# Patient Record
Sex: Female | Born: 1980 | Race: White | Hispanic: No | Smoking: Former smoker
Health system: Southern US, Community
[De-identification: ages and names within clinical notes are randomized; demographics above are authoritative.]

## PROBLEM LIST (undated history)

## (undated) DIAGNOSIS — T7840XA Allergy, unspecified, initial encounter: Secondary | ICD-10-CM

## (undated) DIAGNOSIS — A6000 Herpesviral infection of urogenital system, unspecified: Secondary | ICD-10-CM

## (undated) DIAGNOSIS — F329 Major depressive disorder, single episode, unspecified: Secondary | ICD-10-CM

## (undated) DIAGNOSIS — K219 Gastro-esophageal reflux disease without esophagitis: Secondary | ICD-10-CM

## (undated) DIAGNOSIS — F32A Depression, unspecified: Secondary | ICD-10-CM

## (undated) DIAGNOSIS — K222 Esophageal obstruction: Secondary | ICD-10-CM

## (undated) DIAGNOSIS — N189 Chronic kidney disease, unspecified: Secondary | ICD-10-CM

## (undated) DIAGNOSIS — F419 Anxiety disorder, unspecified: Secondary | ICD-10-CM

## (undated) HISTORY — PX: OTHER SURGICAL HISTORY: SHX169

## (undated) HISTORY — DX: Gastro-esophageal reflux disease without esophagitis: K21.9

## (undated) HISTORY — DX: Chronic kidney disease, unspecified: N18.9

## (undated) HISTORY — DX: Esophageal obstruction: K22.2

## (undated) HISTORY — DX: Depression, unspecified: F32.A

## (undated) HISTORY — DX: Allergy, unspecified, initial encounter: T78.40XA

## (undated) HISTORY — PX: KIDNEY STONE SURGERY: SHX686

## (undated) HISTORY — DX: Major depressive disorder, single episode, unspecified: F32.9

---

## 2000-12-09 DIAGNOSIS — N189 Chronic kidney disease, unspecified: Secondary | ICD-10-CM

## 2000-12-09 HISTORY — DX: Chronic kidney disease, unspecified: N18.9

## 2008-08-11 ENCOUNTER — Emergency Department (HOSPITAL_COMMUNITY): Admission: EM | Admit: 2008-08-11 | Discharge: 2008-08-11 | Payer: Self-pay | Admitting: Family Medicine

## 2008-08-17 ENCOUNTER — Emergency Department (HOSPITAL_COMMUNITY): Admission: EM | Admit: 2008-08-17 | Discharge: 2008-08-17 | Payer: Self-pay | Admitting: Emergency Medicine

## 2010-02-09 ENCOUNTER — Ambulatory Visit: Payer: Self-pay | Admitting: *Deleted

## 2010-02-14 ENCOUNTER — Ambulatory Visit: Payer: Self-pay | Admitting: *Deleted

## 2010-02-21 ENCOUNTER — Ambulatory Visit: Payer: Self-pay | Admitting: *Deleted

## 2010-04-11 ENCOUNTER — Ambulatory Visit: Payer: Self-pay | Admitting: Family Medicine

## 2010-04-11 ENCOUNTER — Encounter (INDEPENDENT_AMBULATORY_CARE_PROVIDER_SITE_OTHER): Payer: Self-pay | Admitting: *Deleted

## 2010-09-25 ENCOUNTER — Ambulatory Visit (HOSPITAL_COMMUNITY): Payer: Self-pay | Admitting: Psychiatry

## 2010-10-17 ENCOUNTER — Ambulatory Visit (HOSPITAL_COMMUNITY): Payer: Self-pay | Admitting: Psychiatry

## 2010-11-07 ENCOUNTER — Ambulatory Visit (HOSPITAL_COMMUNITY): Payer: Self-pay | Admitting: Psychiatry

## 2010-11-23 ENCOUNTER — Ambulatory Visit (HOSPITAL_COMMUNITY): Payer: Self-pay | Admitting: Psychiatry

## 2010-12-14 ENCOUNTER — Ambulatory Visit (HOSPITAL_COMMUNITY)
Admission: RE | Admit: 2010-12-14 | Discharge: 2010-12-14 | Payer: Self-pay | Source: Home / Self Care | Attending: Psychiatry | Admitting: Psychiatry

## 2010-12-25 ENCOUNTER — Ambulatory Visit (HOSPITAL_COMMUNITY): Admit: 2010-12-25 | Payer: Self-pay | Admitting: Psychiatry

## 2010-12-28 ENCOUNTER — Ambulatory Visit (HOSPITAL_COMMUNITY): Admit: 2010-12-28 | Payer: Self-pay | Admitting: Psychiatry

## 2011-01-08 NOTE — Letter (Signed)
Summary: [place] No Show Letter  [place] [facility] [place]  [address] [place], [place] [address]   Phone: [phone]  Fax: [phone]    04/11/2010 MRN: [id number]  Carolyn Rasmussen [address] [place], [place]  [address]   Dear Ms. Schlottman,   Our records indicate that you missed your scheduled appointment with _______Dr.Tabori__________ on _____5/4/11______.  Please contact this office to reschedule your appointment as soon as possible.  It is important that you keep your scheduled appointments with your physician, so we can provide you the best care possible.  Please be advised that there may be a charge for "no show" appointments.    Sincerely,   [place] [facility]

## 2011-01-22 ENCOUNTER — Encounter (HOSPITAL_COMMUNITY): Payer: Commercial Managed Care - PPO | Admitting: Physician Assistant

## 2011-01-22 DIAGNOSIS — F3130 Bipolar disorder, current episode depressed, mild or moderate severity, unspecified: Secondary | ICD-10-CM

## 2011-02-12 ENCOUNTER — Encounter (HOSPITAL_COMMUNITY): Payer: Commercial Managed Care - PPO | Admitting: Physician Assistant

## 2011-02-12 DIAGNOSIS — F0390 Unspecified dementia without behavioral disturbance: Secondary | ICD-10-CM

## 2011-02-19 ENCOUNTER — Encounter (HOSPITAL_COMMUNITY): Payer: Commercial Managed Care - PPO | Admitting: Psychology

## 2011-03-26 ENCOUNTER — Encounter (HOSPITAL_COMMUNITY): Payer: Commercial Managed Care - PPO | Admitting: Physician Assistant

## 2011-03-26 DIAGNOSIS — F309 Manic episode, unspecified: Secondary | ICD-10-CM

## 2011-04-09 ENCOUNTER — Encounter (HOSPITAL_COMMUNITY): Payer: Commercial Managed Care - PPO | Admitting: Psychology

## 2011-04-09 DIAGNOSIS — F332 Major depressive disorder, recurrent severe without psychotic features: Secondary | ICD-10-CM

## 2011-04-23 ENCOUNTER — Encounter (HOSPITAL_COMMUNITY): Payer: Commercial Managed Care - PPO | Admitting: Psychology

## 2011-04-23 DIAGNOSIS — F331 Major depressive disorder, recurrent, moderate: Secondary | ICD-10-CM

## 2011-04-25 ENCOUNTER — Encounter (HOSPITAL_COMMUNITY): Payer: Commercial Managed Care - PPO | Admitting: Physician Assistant

## 2011-04-25 DIAGNOSIS — F331 Major depressive disorder, recurrent, moderate: Secondary | ICD-10-CM

## 2011-05-03 ENCOUNTER — Encounter (HOSPITAL_COMMUNITY): Payer: Commercial Managed Care - PPO | Admitting: Psychology

## 2011-05-03 DIAGNOSIS — F331 Major depressive disorder, recurrent, moderate: Secondary | ICD-10-CM

## 2011-05-09 ENCOUNTER — Ambulatory Visit (INDEPENDENT_AMBULATORY_CARE_PROVIDER_SITE_OTHER): Payer: Commercial Managed Care - PPO | Admitting: Family Medicine

## 2011-05-09 ENCOUNTER — Encounter: Payer: Self-pay | Admitting: Family Medicine

## 2011-05-09 DIAGNOSIS — J40 Bronchitis, not specified as acute or chronic: Secondary | ICD-10-CM

## 2011-05-09 MED ORDER — GUAIFENESIN-CODEINE 100-10 MG/5ML PO SYRP: 5.0000 mL | ORAL_SOLUTION | Freq: Two times a day (BID) | ORAL | Status: DC | PRN

## 2011-05-09 MED ORDER — BENZONATATE 200 MG PO CAPS: 200.0000 mg | ORAL_CAPSULE | Freq: Three times a day (TID) | ORAL | Status: AC | PRN

## 2011-05-09 MED ORDER — AZITHROMYCIN 250 MG PO TABS: 250.0000 mg | ORAL_TABLET | Freq: Every day | ORAL | Status: AC

## 2011-05-09 NOTE — Progress Notes (Signed)
  Subjective:    Patient ID: Carolyn Rasmussen, female    DOB: 10/25/1981, 30 y.o.   MRN: [id number]  HPI New to establish.  Previous MD- none.  URI- 'really bad sore throat, coughing up stuff'.  Denies facial pain/pressure.  No fevers.  L ear pain.  + sick contacts.  sxs started Monday.  Works in [employer].   Review of Systems For ROS see HPI     Objective:   Physical Exam  Constitutional: She appears well-developed and well-nourished. No distress.  HENT:  Head: Normocephalic and atraumatic.  Nose: Nose normal.  Mouth/Throat: Oropharynx is clear and moist.       TMs normal bilaterally Mild nasal congestion Throat w/out erythema, edema, or exudate  Eyes: Conjunctivae and EOM are normal. Pupils are equal, round, and reactive to light.  Neck: Normal range of motion. Neck supple.  Cardiovascular: Normal rate, regular rhythm, normal heart sounds and intact distal pulses.   No murmur heard. Pulmonary/Chest: Effort normal and breath sounds normal. No respiratory distress. She has no wheezes.       + hacking cough  Lymphadenopathy:    She has no cervical adenopathy.          Assessment & Plan:

## 2011-05-09 NOTE — Patient Instructions (Signed)
This appears to be a bronchitis Take the Azithromycin as directed Use the Tessalon for daytime cough and the codeine syrup for night Add the mucinex to thin your congestion Call with any questions or concerns Hang in there!!!

## 2011-05-12 DIAGNOSIS — J40 Bronchitis, not specified as acute or chronic: Secondary | ICD-10-CM | POA: Insufficient documentation

## 2011-05-12 NOTE — Assessment & Plan Note (Signed)
Pt's sxs and PE consistent w/ bronchitis.  Start zpack given work environment.  Cough meds prn.

## 2011-05-24 ENCOUNTER — Encounter (HOSPITAL_COMMUNITY): Payer: Commercial Managed Care - PPO | Admitting: Psychology

## 2011-06-04 ENCOUNTER — Encounter (HOSPITAL_COMMUNITY): Payer: Commercial Managed Care - PPO | Admitting: Psychology

## 2011-06-06 ENCOUNTER — Encounter (HOSPITAL_COMMUNITY): Payer: Commercial Managed Care - PPO | Admitting: Physician Assistant

## 2011-06-13 ENCOUNTER — Encounter (INDEPENDENT_AMBULATORY_CARE_PROVIDER_SITE_OTHER): Payer: 59 | Admitting: Physician Assistant

## 2011-06-13 DIAGNOSIS — F339 Major depressive disorder, recurrent, unspecified: Secondary | ICD-10-CM

## 2011-09-02 ENCOUNTER — Ambulatory Visit (INDEPENDENT_AMBULATORY_CARE_PROVIDER_SITE_OTHER): Payer: 59 | Admitting: Family Medicine

## 2011-09-02 ENCOUNTER — Encounter: Payer: Self-pay | Admitting: Family Medicine

## 2011-09-02 DIAGNOSIS — Z3009 Encounter for other general counseling and advice on contraception: Secondary | ICD-10-CM

## 2011-09-02 DIAGNOSIS — Z309 Encounter for contraceptive management, unspecified: Secondary | ICD-10-CM

## 2011-09-02 NOTE — Patient Instructions (Signed)
Follow up as needed Discuss the meds w/ your psychiatrist and OB once you have an appt Keep up the good work!  You look great! Call with any questions or concerns GOOD LUCK!!!

## 2011-09-02 NOTE — Progress Notes (Signed)
  Subjective:    Patient ID: Carolyn Rasmussen, female    DOB: March 13, 1981, 30 y.o.   MRN: [id number]  HPI Family planning- is having IUD removed on Thursday.  Wants to get pregnant.  On PNV.  Has appt w/ psych to discuss Abilify and Lexapro.  Thinks she took Lexapro when pregnant w/ the boys.  Wants to know my thoughts on psych meds and pregnancy.  Reports depression worsened during her previous pregnancies.  Is planning on seeing [facility] for pregnancy.   Review of Systems For ROS see HPI     Objective:   Physical Exam  Vitals reviewed. Constitutional: She appears well-developed and well-nourished. No distress.  Psychiatric: She has a normal mood and affect. Her behavior is normal. Judgment and thought content normal.          Assessment & Plan:

## 2011-09-03 DIAGNOSIS — Z3009 Encounter for other general counseling and advice on contraception: Secondary | ICD-10-CM | POA: Insufficient documentation

## 2011-09-03 NOTE — Assessment & Plan Note (Signed)
Strongly encouraged pt to discuss meds w/ both psych and OB.  Both meds are Category C in pregnancy w/ Lexapro contraindicated during 3rd trimester.  That being said, mom's health needs to be maintained during her pregnancy.  Applauded use of PNV.  Will follow along and assist as able.

## 2011-09-05 ENCOUNTER — Encounter (HOSPITAL_COMMUNITY): Payer: 59 | Admitting: Physician Assistant

## 2011-09-25 ENCOUNTER — Ambulatory Visit (INDEPENDENT_AMBULATORY_CARE_PROVIDER_SITE_OTHER): Payer: 59 | Admitting: Family Medicine

## 2011-09-25 ENCOUNTER — Encounter: Payer: Self-pay | Admitting: Family Medicine

## 2011-09-25 ENCOUNTER — Encounter: Payer: Self-pay | Admitting: Gastroenterology

## 2011-09-25 DIAGNOSIS — R1310 Dysphagia, unspecified: Secondary | ICD-10-CM | POA: Insufficient documentation

## 2011-09-25 MED ORDER — ESOMEPRAZOLE MAGNESIUM 40 MG PO CPDR: 40.0000 mg | DELAYED_RELEASE_CAPSULE | Freq: Every day | ORAL | Status: DC

## 2011-09-25 NOTE — Patient Instructions (Signed)
Someone will call you with your GI appt Switch to Nexium.  If the Nexium is expensive- call me! Call with any questions or concerns Hang in there!!!

## 2011-09-25 NOTE — Progress Notes (Signed)
  Subjective:    Patient ID: Carolyn Rasmussen, female    DOB: [birth year], 30 y.o.   MRN: [id number]  HPI Dysphagia- reports difficulty w/ food passing from esophagus to stomach, 'it gets stuck right here' (pointing to just under L ribs).  Has been having sxs x6 months.  + GERD for 'years'.  On prilosec.  Father has had to have multiple esophageal dilation   Review of Systems For ROS see HPI     Objective:   Physical Exam  Vitals reviewed. Constitutional: She appears well-developed and well-nourished. No distress.  Abdominal: Soft. Bowel sounds are normal. She exhibits no distension. There is no tenderness. There is no rebound.          Assessment & Plan:

## 2011-09-29 NOTE — Assessment & Plan Note (Signed)
Switch from OTC omeprazole to Nexium.  Refer to GI for complete eval and tx.

## 2011-10-12 ENCOUNTER — Other Ambulatory Visit (HOSPITAL_COMMUNITY): Payer: Self-pay

## 2011-10-14 ENCOUNTER — Encounter (HOSPITAL_COMMUNITY): Payer: Self-pay | Admitting: Physician Assistant

## 2011-10-14 ENCOUNTER — Ambulatory Visit (HOSPITAL_COMMUNITY): Payer: 59 | Admitting: Physician Assistant

## 2011-10-14 DIAGNOSIS — F339 Major depressive disorder, recurrent, unspecified: Secondary | ICD-10-CM

## 2011-10-14 DIAGNOSIS — F330 Major depressive disorder, recurrent, mild: Secondary | ICD-10-CM | POA: Insufficient documentation

## 2011-10-14 NOTE — Progress Notes (Signed)
  Subjective:   Carolyn Rasmussen is an 30 y.o. female who presents for follow up on treatment of her major depression disorder.  Things have gone well for [name] since her last visit with me.  She has moved into a new house with her sons and her boyfriend. She tells me she is now engaged to be married and is wearing a lovely diamond engagement ring.  Additionally she also tells me she is trying to have a baby.  She is still taking her Abilify and Lexapro. Both of which are category C for pregnancy.  However, she notes being on a psych med while pregnant both times previously.  She also states that she was in patient both times when pregnant.    [name] also notes that she had to drop out of school this semester due to taking on too much at one time.  She was working full time and going to school full time, taking 2 lab sciences.  She plans to return to [employer] in the Spring with a more realistic work load.    She has seen a [facility] at [facility] for individual out patient therapy.           Review of Systems Pertinent items are noted in HPI.   Objective:   Mental Status Examination: Posture and motor behavior: Appropriate Dress, grooming, personal hygiene: Appropriate Facial expression: Appropriate Speech: Appropriate Mood: Appropriate Coherency and relevance of thought: Appropriate Thought content: Appropriate Perceptions: Appropriate Orientation:Appropriate Attention and concentration: Appropriate Memory: : Appropriate Information: Not examined Vocabulary: Appropriate Abstract reasoning: Appropriate Judgment: Appropriate    Assessment:   Experiencing the following symptoms of depression most of the day nearly every day for more than two consecutive weeks: depressed mood  Depressive Disorder:Improving  Suicide Risk Assessment:  Suicidal intent: no Suicidal plan: none Access to means for suicide: no Lethality of means for suicide: no Prior suicide attempts:  no Recent exposure to suicide:no  Multiple life stressors as the patient wants to return to school, have a baby, and get married all within the next year.  Plan:   Pt. Will slowly discontinue the Abilify as educated, and detailed plan is discussed with the patient. She will follow up with her OB-GYN to discuss continuing on the Lexapro if she becomes pregnant. Neftali is advised to rethink her plan and her time line for accomplishing her short and long term goals.  She is encouraged to set realistic goals that will not jeopardize her new relationship which seems to be going extremely well.  She is also encouraged not to set herself up for failure by taking on more than is feasibly realistic.   She will follow up with me in 3-4 weeks. 1. Depression, major, recurrent, mild   Reviewed concept of depression as biochemical imbalance of neurotransmitters and rationale for treatment. Instructed patient to contact office or on-call physician promptly should condition worsen or any new symptoms appear and provided on-call telephone numbers.

## 2011-10-14 NOTE — Patient Instructions (Signed)
  Place depression patient instructions here.

## 2011-10-16 ENCOUNTER — Encounter: Payer: Self-pay | Admitting: Gastroenterology

## 2011-10-16 ENCOUNTER — Ambulatory Visit (INDEPENDENT_AMBULATORY_CARE_PROVIDER_SITE_OTHER): Payer: 59 | Admitting: Gastroenterology

## 2011-10-16 VITALS — BP 106/72 | HR 68 | Ht 65.75 in | Wt 173.0 lb

## 2011-10-16 DIAGNOSIS — R1319 Other dysphagia: Secondary | ICD-10-CM

## 2011-10-16 DIAGNOSIS — K219 Gastro-esophageal reflux disease without esophagitis: Secondary | ICD-10-CM

## 2011-10-16 NOTE — Patient Instructions (Signed)
You have been scheduled for a Upper Endoscopy with [facility]. See separate instructions.  Patient advised to avoid spicy, acidic, citrus, chocolate, mints, fruit and fruit juices.  Limit the intake of caffeine, alcohol and Soda.  Don't exercise too soon after eating.  Don't lie down within 3-4 hours of eating.  Elevate the head of your bed. Continue Nexium daily 30 minutes before breakfast.  cc: Neena Rhymes, MD

## 2011-10-16 NOTE — Progress Notes (Signed)
History of Present Illness: This is a 30 year old female who relates a 5 or 6 year history of frequent reflux symptoms that were previously controlled on Prilosec OTC. Over the past several months her symptoms have worsened and she was recently placed on Nexium with better control of her symptoms. She has had intermittent solid food dysphagia for the past year occasionally required self-induced vomiting. Denies weight loss, abdominal pain, constipation, diarrhea, change in stool caliber, melena, hematochezia, nausea, vomiting, chest pain.  Review of Systems: Pertinent positive and negative review of systems were noted in the above HPI section. All other review of systems were otherwise negative.  Current Medications, Allergies, Past Medical History, Past Surgical History, Family History and Social History were reviewed in [facility] record.  Physical Exam: General: Well developed , well nourished, no acute distress Head: Normocephalic and atraumatic Eyes:  sclerae anicteric, EOMI Ears: Normal auditory acuity Mouth: No deformity or lesions Neck: Supple, no masses or thyromegaly Lungs: Clear throughout to auscultation Heart: Regular rate and rhythm; no murmurs, rubs or bruits Abdomen: Soft, non tender and non distended. No masses, hepatosplenomegaly or hernias noted. Normal Bowel sounds Musculoskeletal: Symmetrical with no gross deformities  Skin: No lesions on visible extremities Pulses:  Normal pulses noted Extremities: No clubbing, cyanosis, edema or deformities noted Neurological: Alert oriented x 4, grossly nonfocal Cervical Nodes:  No significant cervical adenopathy Inguinal Nodes: No significant inguinal adenopathy Psychological:  Alert and cooperative. Normal mood and affect  Assessment and Recommendations:  1. GERD and solid food dysphagia.  I suspect a peptic stricture. Standard antireflux measures and Nexium 40 mg every morning. Schedule endoscopy with  dilation. The risks, benefits, and alternatives to endoscopy with possible biopsy and possible dilation were discussed with the patient and they consent to proceed.

## 2011-10-21 ENCOUNTER — Encounter (HOSPITAL_COMMUNITY): Payer: 59 | Admitting: Physician Assistant

## 2011-10-23 ENCOUNTER — Ambulatory Visit (AMBULATORY_SURGERY_CENTER): Payer: 59 | Admitting: Gastroenterology

## 2011-10-23 ENCOUNTER — Encounter: Payer: Self-pay | Admitting: Gastroenterology

## 2011-10-23 VITALS — BP 109/78 | HR 79 | Temp 96.6°F | Resp 20 | Ht 65.0 in | Wt 173.0 lb

## 2011-10-23 DIAGNOSIS — K209 Esophagitis, unspecified without bleeding: Secondary | ICD-10-CM

## 2011-10-23 DIAGNOSIS — R1319 Other dysphagia: Secondary | ICD-10-CM

## 2011-10-23 DIAGNOSIS — K219 Gastro-esophageal reflux disease without esophagitis: Secondary | ICD-10-CM

## 2011-10-23 DIAGNOSIS — R1310 Dysphagia, unspecified: Secondary | ICD-10-CM

## 2011-10-23 DIAGNOSIS — K222 Esophageal obstruction: Secondary | ICD-10-CM

## 2011-10-23 MED ORDER — SODIUM CHLORIDE 0.9 % IV SOLN: 500.0000 mL | INTRAVENOUS | Status: DC

## 2011-10-23 NOTE — Patient Instructions (Signed)
Please follow the ESOPHAGEAL DILATION DIET as follows: -Nothing by mouth until 12:15pm -Clear liquids for 1 hour 12:15pm-1:15pm -Soft Foods for the rest of today 1:15pm until tomorrow  Esophageal Stricture The esophagus is the long, narrow tube which carries food and liquid from the mouth to the stomach. Sometimes a part of the esophagus becomes narrow and makes it difficult, painful, or even impossible to swallow. This is called an esophageal stricture.  CAUSES  Common causes of blockage or strictures of the esophagus are:  Exposure of the lower esophagus to the acid from the stomach may cause narrowing.   Hiatal hernia in which a small part of the stomach bulges up through the diaphragm can cause a narrowing in the bottom of the esophagus.   Scleroderma is a tissue disorder that affects the esophagus and makes swallowing difficult.   Achalasia is an absence of nerves in the lower esophagus and to the esophageal sphincter. This absence of nerves may be congenital (present since birth). This can cause irregular spasms which do not allow food and fluid through.   Strictures may develop from swallowing materials which damage the esophagus. Examples are acids or alkalis such as lye.   Schatzki's Ring is a narrow ring of non-cancerous tissue which narrows the lower esophagus. The cause of this is unknown.   Growths can block the esophagus.  SYMPTOMS  Some of the problems are difficulty swallowing or pain with swallowing. DIAGNOSIS  Your caregiver often suspects this problem by taking a medical history. They will also do a physical exam. They may then take X-rays and/or perform an endoscopy. Endoscopy is an exam in which a tube like a small flexible telescope is used to look at your esophagus.  TREATMENT  One form of treatment is to dilate the narrow area. This means to stretch it.   When this is not successful, chest surgery may be required. This is a much more extensive form of treatment  with a longer recovery time.  Both of the above treatments make the passage of food and water into the stomach easier. They also make it easier for stomach contents to bubble back into the esophagus. Special medications may be used following the procedure to help prevent further narrowing. Medications may be used to lower the amount of acid in the stomach juice.  SEEK IMMEDIATE MEDICAL CARE IF:   Your swallowing is becoming more painful, difficult, or you are unable to swallow.   You vomit up blood.   You develop black tarry stools.   You develop chills.   You have a fever.   You develop chest or abdominal pain.   You develop shortness of breath, feel lightheaded, or faint.  Follow up with medical care as your caregiver suggests. Document Released: 08/05/2006 Document Revised: 08/07/2011 Document Reviewed: 09/11/2006 [facility] Patient Information 2012 [place], [place].

## 2011-10-24 ENCOUNTER — Telehealth: Payer: Self-pay | Admitting: *Deleted

## 2011-10-24 NOTE — Telephone Encounter (Signed)
Left message for patient to call us if necessary.

## 2011-10-29 ENCOUNTER — Encounter: Payer: Self-pay | Admitting: Gastroenterology

## 2011-11-11 ENCOUNTER — Ambulatory Visit (HOSPITAL_COMMUNITY): Payer: 59 | Admitting: Physician Assistant

## 2011-11-18 ENCOUNTER — Ambulatory Visit (INDEPENDENT_AMBULATORY_CARE_PROVIDER_SITE_OTHER): Payer: 59 | Admitting: Physician Assistant

## 2011-11-18 DIAGNOSIS — F331 Major depressive disorder, recurrent, moderate: Secondary | ICD-10-CM

## 2011-11-18 DIAGNOSIS — F4310 Post-traumatic stress disorder, unspecified: Secondary | ICD-10-CM

## 2011-11-18 NOTE — Progress Notes (Signed)
   [facility]al Health Follow-up Outpatient Visit  [name] November 16, 1981  Date: 11/18/11   Subjective: Carolyn Rasmussen has been followed by Carolyn Rasmussen for the past year. Carolyn Rasmussen has been treating her for symptoms of depression, and she has been stable. At their last appointment, Carolyn Rasmussen stated she was trying to get pregnant, and Carolyn Rasmussen, discontinued her Abilify. That was approximately one month ago, and Carolyn Rasmussen states that, although she is okay, she can tell, that she is not as well managed. Her sleep has been difficult. She has difficulty in initiating sleep, and wakes during the night, occasionally. She denies any suicidal or homicidal ideation. She denies any auditory or visual hallucinations. She reports that she eats for comfort, particularly foods, like macaroni and cheese and ice cream. Carolyn Rasmussen is looking forward to marrying her fianc in November of 2013   There were no vitals filed for this visit.  Mental Status Examination  Appearance:  Well groomed and casually dressed  Alert: Yes Attention: good  Cooperative: Yes Eye Contact: Good Speech:  clear and even  Psychomotor Activity: Normal Memory/Concentration:  intact Oriented: person, place, time/date and situation Mood: Anxious, mildly Affect: Congruent Thought Processes and Associations: Linear Fund of Knowledge: Good Thought Content:  Insight: Good Judgement: Good  Diagnosis:  Maj. depressive disorder, recurrent, moderate, PTSD   Treatment Plan:  WE will continue her Lexapro at 20 mg daily. She has been instructed in relaxation techniques to improve her time to sleep. She also has been advised to increase her exercise level. She has also been advised to eat a healthier diet. Carolyn Rasmussen will return for followup in 2 months.   Suman Trivedi, PA

## 2011-12-25 ENCOUNTER — Ambulatory Visit (INDEPENDENT_AMBULATORY_CARE_PROVIDER_SITE_OTHER): Payer: 59 | Admitting: Family Medicine

## 2011-12-25 ENCOUNTER — Ambulatory Visit: Payer: 59

## 2011-12-25 ENCOUNTER — Encounter: Payer: Self-pay | Admitting: Family Medicine

## 2011-12-25 DIAGNOSIS — N912 Amenorrhea, unspecified: Secondary | ICD-10-CM

## 2011-12-25 DIAGNOSIS — H8110 Benign paroxysmal vertigo, unspecified ear: Secondary | ICD-10-CM

## 2011-12-25 DIAGNOSIS — H9209 Otalgia, unspecified ear: Secondary | ICD-10-CM | POA: Insufficient documentation

## 2011-12-25 MED ORDER — MECLIZINE HCL 25 MG PO TABS: 25.0000 mg | ORAL_TABLET | Freq: Three times a day (TID) | ORAL | Status: AC | PRN

## 2011-12-25 NOTE — Patient Instructions (Signed)
We'll notify you of your lab results Use the Meclizine as needed for vertigo Change positions slowly Drink plenty of fluids Do the exercises to desensitize your ear canals Call with any questions or concerns Hang in there!!!

## 2011-12-25 NOTE — Assessment & Plan Note (Signed)
New.  Pt is hoping to be pregnant.  2 weeks overdue for menses.  Had spotting at time of expected menses.  Pt's home Upreg's have been negative.  Will draw serum test today.

## 2011-12-25 NOTE — Assessment & Plan Note (Signed)
Pt w/ hx of similar.  No red flags on PE.  Start Meclizine prn.  Modified Eppley maneuver handout given along w/ instructions.  Reviewed supportive care and red flags that should prompt return.  Pt expressed understanding and is in agreement w/ plan.

## 2011-12-25 NOTE — Assessment & Plan Note (Signed)
No obvious infection.  This is chronic problem for pt.  No abx at this time.  If continued pain will need ENT referral.

## 2011-12-25 NOTE — Progress Notes (Signed)
  Subjective:    Patient ID: Carolyn Rasmussen, female    DOB: Mar 30, 1981, 31 y.o.   MRN: [id number]  HPI Ear pain- pain has been intermittent, hx of multiple infxns.  Reports she always has intermittent pain but lately this seems worse.  Denies fever or drainage.  Vertigo- sxs started 3 weeks ago.  Worse w/ lying down, sitting up, or turning head rapidly.  Hx of similar.  Drinking a lot of water.  Amenorrhea- LMP 12/3.  Home Upregs have been negative.  Some nausea but also having vertigo.  Had spotting on January 3rd, some mild cramping.   Review of Systems     Objective:   Physical Exam  Vitals reviewed. Constitutional: She is oriented to person, place, and time. She appears well-developed and well-nourished. No distress.  HENT:  Head: Normocephalic and atraumatic.  Nose: Nose normal.  Mouth/Throat: Oropharynx is clear and moist.       No TTP over sinuses TMs normal bilaterally  Eyes: Conjunctivae and EOM are normal. Pupils are equal, round, and reactive to light.  Neck: Normal range of motion. Neck supple. No thyromegaly present.  Lymphadenopathy:    She has no cervical adenopathy.  Neurological: She is alert and oriented to person, place, and time. She has normal reflexes. No cranial nerve deficit. Coordination normal.          Assessment & Plan:

## 2012-01-22 ENCOUNTER — Ambulatory Visit (HOSPITAL_COMMUNITY): Payer: 59 | Admitting: Physician Assistant

## 2012-01-22 ENCOUNTER — Ambulatory Visit (INDEPENDENT_AMBULATORY_CARE_PROVIDER_SITE_OTHER): Payer: 59 | Admitting: Physician Assistant

## 2012-01-22 DIAGNOSIS — F411 Generalized anxiety disorder: Secondary | ICD-10-CM

## 2012-01-22 DIAGNOSIS — F331 Major depressive disorder, recurrent, moderate: Secondary | ICD-10-CM

## 2012-01-22 MED ORDER — TRAZODONE HCL 50 MG PO TABS: 50.0000 mg | ORAL_TABLET | Freq: Every day | ORAL | Status: DC

## 2012-01-22 MED ORDER — DULOXETINE HCL 30 MG PO CPEP: ORAL_CAPSULE | ORAL | Status: DC

## 2012-02-06 ENCOUNTER — Ambulatory Visit (INDEPENDENT_AMBULATORY_CARE_PROVIDER_SITE_OTHER): Payer: 59 | Admitting: Psychology

## 2012-02-06 DIAGNOSIS — F332 Major depressive disorder, recurrent severe without psychotic features: Secondary | ICD-10-CM
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
## 2012-02-19 ENCOUNTER — Ambulatory Visit (INDEPENDENT_AMBULATORY_CARE_PROVIDER_SITE_OTHER): Payer: 59 | Admitting: Physician Assistant

## 2012-02-19 DIAGNOSIS — F411 Generalized anxiety disorder: Secondary | ICD-10-CM

## 2012-02-19 DIAGNOSIS — F331 Major depressive disorder, recurrent, moderate: Secondary | ICD-10-CM

## 2012-02-19 MED ORDER — DULOXETINE HCL 30 MG PO CPEP: 90.0000 mg | ORAL_CAPSULE | Freq: Every day | ORAL | Status: DC

## 2012-02-25 NOTE — Progress Notes (Signed)
   [facility] Behavioral Health Follow-up Outpatient Visit  Carolyn Rasmussen [birth year]  Date: 02/19/12   Subjective: Carolyn Rasmussen presents today to follow up on her medications prescribed for depression.  She reports that her mood has improved on Cymbalta. She also reports that her sleep has improved. She states now that she does not want to sleep all day and all night. She does sleep about 11 hours nightly, as opposed to the 15-16 hours she was sleeping previously. She also reports that she is being more productive in household duties and family activities. She reports that her mood overall is better, but she is still somewhat melancholy. She is also exercising more and eating better. She would like to increase her Cymbalta to 60 mg daily. She denies any suicidal or homicidal ideation. She denies any auditory or visual hallucinations.  There were no vitals filed for this visit.  Mental Status Examination  Appearance: well groomed and casually dressed Alert: Yes Attention: good  Cooperative: Yes Eye Contact: Good Speech: clear and even Psychomotor Activity: Normal Memory/Concentration: intact Oriented: person, place, time/date and situation Mood: Anxious Affect: Appropriate Thought Processes and Associations: Goal Directed and Linear Fund of Knowledge: Good Thought Content:  Insight: Fair Judgement: Good  Diagnosis: Maj. Depressive disorder recurrent, moderate; generalized anxiety disorder.  Treatment Plan: we will increase her Cymbalta to 90 mg daily, and continue her trazodone for sleep. She is encouraged to continue exercising and eating a healthy diet. She will followup in 6 weeks.  Carolyn Daus, PA

## 2012-03-02 NOTE — Progress Notes (Signed)
   [facility] Behavioral Health Follow-up Outpatient Visit  Carolyn Rasmussen Jul 02, 1981  Date: 01/22/12   Subjective: Carolyn Rasmussen presents today to followup on her medications prescribed for depression. She complains that she is sleeping excessively, and her depression is not well managed. She wonders if there is something that she can take other than the Abilify which she found very expensive. She denies any suicidal or homicidal ideation. She denies any auditory or visual hallucinations.  There were no vitals filed for this visit.  Mental Status Examination  Appearance: Well groomed and casually dressed Alert: Yes Attention: good  Cooperative: Yes Eye Contact: Good Speech: Clear and even Psychomotor Activity: Normal Memory/Concentration: Intact Oriented: person, place, time/date and situation Mood: Depressed Affect: Congruent Thought Processes and Associations: Linear Fund of Knowledge: Good Thought Content: Normal Insight: Good Judgement: Good  Diagnosis: Maj. depressive disorder, recurrent, moderate  Treatment Plan: We will discontinue her Lexapro and start her on Cymbalta 30 mg. She will followup in one month.  Carolyn Wence, PA-C

## 2012-03-05 ENCOUNTER — Ambulatory Visit (HOSPITAL_COMMUNITY): Payer: Self-pay | Admitting: Psychology

## 2012-03-23 ENCOUNTER — Telehealth (HOSPITAL_COMMUNITY): Payer: Self-pay | Admitting: *Deleted

## 2012-03-23 NOTE — Telephone Encounter (Signed)
Pt. VM 4/11 c/o increased depression.Informed Dr.Kumar(Alan WattPA out of office).Per Dr.Kumar, left patient phone message 4/11 at 1620 to increase Cymbalta to 120mg  over w/e & call back 4/15.Contacted pt, did not inc. medication due to side effects. Has been sweating excessively and fells her heart racing. Says 90 mg of Cymbalta has not changed how she feels. Stayed in bed all weekend, did not go to work.  Would like to feel better.

## 2012-04-06 ENCOUNTER — Ambulatory Visit (INDEPENDENT_AMBULATORY_CARE_PROVIDER_SITE_OTHER): Payer: 59 | Admitting: Physician Assistant

## 2012-04-06 DIAGNOSIS — F411 Generalized anxiety disorder: Secondary | ICD-10-CM

## 2012-04-06 DIAGNOSIS — F331 Major depressive disorder, recurrent, moderate: Secondary | ICD-10-CM

## 2012-04-06 MED ORDER — TRAZODONE HCL 50 MG PO TABS: 50.0000 mg | ORAL_TABLET | Freq: Every day | ORAL | Status: DC

## 2012-04-06 MED ORDER — LAMOTRIGINE 25 MG PO TABS: ORAL_TABLET | ORAL | Status: DC

## 2012-04-06 NOTE — Progress Notes (Signed)
   [facility] Behavioral Health Follow-up Outpatient Visit  Carolyn Rasmussen 1981-10-27  Date: 04/06/2012   Subjective: Carolyn Rasmussen presents today to followup on her medications prescribed for depression and anxiety. She reports with the increase of Cymbalta to 90 mg her baseline mood has improved, but she feels that her mood swings have gotten more extreme. She reports that couple of weeks ago she was having some suicidal ideation with a plan to inject lidocaine intravenously. She does endorse, though, that she feels more motivated and her energy has improved. She has been taking the trazodone only on nights when she feels that she will not be oh to sleep, and she sleeps well on the nights that she takes the trazodone, but the nights that she does not take the trazodone she has a longer period of time to sleep onset, and she wakes him during the night. She attributes some of her mood swings to her work schedule where she flip-flops between the day and night shift. She hopes that in July her schedule will be more stable. She denies any current suicidal or homicidal ideation. She denies any auditory or visual hallucinations.  There were no vitals filed for this visit.  Mental Status Examination  Appearance: Casual Alert: Yes Attention: good  Cooperative: Yes Eye Contact: Good Speech: Clear and even Psychomotor Activity: Normal Memory/Concentration: Intact Oriented: person, place, time/date and situation Mood: Dysphoric Affect: Blunt Thought Processes and Associations: Logical Fund of Knowledge: Good Thought Content: Normal Insight: Fair Judgement: Good  Diagnosis: Maj. depressive disorder recurrent moderate, generalized anxiety disorder  Treatment Plan: We will continue her trazodone at 50 mg nightly, and encourage her to take at least 25 mg every night. We'll also continue the Cymbalta 90 mg daily. We will add lamotrigine starting at 25 mg daily for 2 weeks, then increase to 50 mg daily  for 2 weeks, then increase to 100 mg daily. She'll return for followup in 5 weeks.  Odessa Morren, PA-C

## 2012-04-14 ENCOUNTER — Ambulatory Visit (INDEPENDENT_AMBULATORY_CARE_PROVIDER_SITE_OTHER): Payer: 59 | Admitting: Psychology

## 2012-04-14 DIAGNOSIS — F332 Major depressive disorder, recurrent severe without psychotic features: Secondary | ICD-10-CM

## 2012-04-14 NOTE — Progress Notes (Signed)
   THERAPIST PROGRESS NOTE  Session Time: 1330 - 1412  Participation Level: Active  Behavioral Response: Casual and NeatAlertDepressed  Type of Therapy: Individual Therapy  Treatment Goals addressed: Coping  Interventions: Supportive and Reframing  Summary: Carolyn Rasmussen is a 31 y.o. female who presents with information that she received my letter telling of my pending retirement and decided she needed to come in to see me before that and request to be assigned to another therapist when I leave.   It has been several months since I have seen her and she catches me up on the recent severe depression after a change of medication.  She also reported that she is living on her own now, having asked Asher Muir to leave.  She has been experiencing problems with parenting her two boys and we talked about that quite a lot.  She notices that they behave much better for her parents, and so has been trying to have some consistency and consequences with them.  Unfortunately, she has very little consistency for herself and finds this very difficult to do with them.  When she felt suicidal, it was with the thought that her children would be better off without her, because then they would be with her parents all the time.  I suggested that she could also give her parents custody of them rather than kill herself to achieve the same end.  Her children have had SI and HI at various times themselves and recently, her 32 year old took one of her antidepressant pills, which scared her.  She has had difficulty making their appointments as well as her own during the last several months, so I reinforced the importance of doing this.  Her affect is blunted, mood depressed and judgment not the best at this time.  Despite this, she also reports that she is feeling better at work and is making a real effort to talk to coworkers and is "even making some friends".  She gives credit to Lamictal for improvement in this social  arena.  Suicidal/Homicidal: Yeswithout intent/plan today, but had felt suicidal and had a plan 3 weeks ago when she considered asking to be admitted to the hospital.  Instead, she slept for most of 3 days and woke feeling somewhat better and then kept her appt. With Jorje Guild, PA and discussed it with him.   Therapist Response: Identified how to seek immediate treatment at any time, day or night, by presenting at [facility] and requesting admission if she soul become suicidal in the future. We talked about future appointments with me for the next several months and then with another counselor.  She does want to do some counseling with her children as well and has an appointment for that. We will be working on her cognitive distortions using CBT   Plan: Return again in 2 weeks.  Diagnosis: Axis I: Major Depression, Recurrent severe    Axis II: Deferred    Ariana Juul, RN 04/14/2012

## 2012-05-08 ENCOUNTER — Ambulatory Visit (HOSPITAL_COMMUNITY): Payer: Self-pay | Admitting: Psychology

## 2012-05-13 ENCOUNTER — Other Ambulatory Visit (HOSPITAL_COMMUNITY): Payer: Self-pay | Admitting: Physician Assistant

## 2012-05-19 ENCOUNTER — Ambulatory Visit (HOSPITAL_COMMUNITY): Payer: 59 | Admitting: Physician Assistant

## 2012-05-19 DIAGNOSIS — F411 Generalized anxiety disorder: Secondary | ICD-10-CM

## 2012-05-19 DIAGNOSIS — F332 Major depressive disorder, recurrent severe without psychotic features: Secondary | ICD-10-CM

## 2012-05-19 MED ORDER — LAMOTRIGINE 100 MG PO TABS: 100.0000 mg | ORAL_TABLET | Freq: Every day | ORAL | Status: DC

## 2012-05-25 ENCOUNTER — Telehealth: Payer: Self-pay | Admitting: Family Medicine

## 2012-05-25 NOTE — Telephone Encounter (Signed)
Refill:Nexium 40mg  capsule. Take 1 capsule by mouth daily. Qty 30. Last fill 04-21-12

## 2012-05-26 MED ORDER — ESOMEPRAZOLE MAGNESIUM 40 MG PO PACK: 40.0000 mg | PACK | Freq: Every day | ORAL | Status: DC

## 2012-05-26 NOTE — Telephone Encounter (Signed)
Called pt to verify she is still taking this medication per noted 3-13 that pt no longer taking due to change in therapy, pt notes she is still taking the medication and does want refill sent to [facility], sent via escribe, pt aware

## 2012-05-26 NOTE — Progress Notes (Signed)
   [facility] Behavioral Health Follow-up Outpatient Visit  Carolyn Rasmussen [birth year]  Date: 05/19/2012   Subjective: [name] presents today to followup on her treatment for her depression and anxiety. At her last appointment she was started on Lamictal using the normal taper of 25 mg daily for 2 weeks and 50 mg daily for 2 weeks then 100 mg daily. She reports she ran out of Lamictal one week ago and stopped taking it. When asked how she was doing she states "not well." She endorses having two horrible weeks where she has wanted to stay in bed, has been crying, her appetite is decreased, she has decreased interest, and has experienced some panic, although her social anxiety has resolved.. She reports that she missed 6 days of work over the past 2 weeks, and was having some suicidal ideation to overdose by injecting herself with lidocaine. She denies any homicidal ideation or auditory or visual hallucinations.  There were no vitals filed for this visit.  Mental Status Examination  Appearance: Fairly groomed and casually dressed Alert: Yes Attention: good  Cooperative: Yes Eye Contact: Good Speech: Clear and coherent Psychomotor Activity: Normal Memory/Concentration: Intact Oriented: person, place, time/date and situation Mood: Anxious, Depressed and Hopeless Affect: Congruent Thought Processes and Associations: Circumstantial and Disorganized Fund of Knowledge: Good Thought Content: Suicidal ideation Insight: Fair Judgement: Good  Diagnosis: Maj. depressive disorder recurrent severe  Treatment Plan: We will resume her Lamictal at 50 mg daily for one week then increase to 100 mg daily. She will continue the Cymbalta at 90 mg daily. She is going on vacation to [place] for one and half weeks, and when she returns she is agreeable to attending the psych IOP program before returning to work. Kassia Demarinis, PA-C

## 2012-07-09 ENCOUNTER — Ambulatory Visit (HOSPITAL_COMMUNITY)
Admission: RE | Admit: 2012-07-09 | Discharge: 2012-07-09 | Disposition: A | Discharge status: Home / Self Care | Payer: 59 | Attending: Psychiatry | Admitting: Psychiatry

## 2012-07-09 ENCOUNTER — Encounter (HOSPITAL_COMMUNITY): Payer: Self-pay | Admitting: *Deleted

## 2012-07-09 DIAGNOSIS — F1910 Other psychoactive substance abuse, uncomplicated: Secondary | ICD-10-CM | POA: Insufficient documentation

## 2012-07-09 DIAGNOSIS — F411 Generalized anxiety disorder: Secondary | ICD-10-CM | POA: Insufficient documentation

## 2012-07-09 DIAGNOSIS — N189 Chronic kidney disease, unspecified: Secondary | ICD-10-CM | POA: Insufficient documentation

## 2012-07-09 DIAGNOSIS — F3289 Other specified depressive episodes: Secondary | ICD-10-CM | POA: Insufficient documentation

## 2012-07-09 DIAGNOSIS — Z8249 Family history of ischemic heart disease and other diseases of the circulatory system: Secondary | ICD-10-CM | POA: Insufficient documentation

## 2012-07-09 DIAGNOSIS — K219 Gastro-esophageal reflux disease without esophagitis: Secondary | ICD-10-CM | POA: Insufficient documentation

## 2012-07-09 DIAGNOSIS — Z9109 Other allergy status, other than to drugs and biological substances: Secondary | ICD-10-CM | POA: Insufficient documentation

## 2012-07-09 DIAGNOSIS — F329 Major depressive disorder, single episode, unspecified: Secondary | ICD-10-CM | POA: Insufficient documentation

## 2012-07-09 DIAGNOSIS — Z8489 Family history of other specified conditions: Secondary | ICD-10-CM | POA: Insufficient documentation

## 2012-07-09 HISTORY — DX: Anxiety disorder, unspecified: F41.9
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
## 2012-07-14 ENCOUNTER — Other Ambulatory Visit (HOSPITAL_COMMUNITY): Payer: 59 | Attending: Psychiatry

## 2012-07-14 ENCOUNTER — Encounter (HOSPITAL_COMMUNITY): Payer: Self-pay

## 2012-07-14 DIAGNOSIS — F332 Major depressive disorder, recurrent severe without psychotic features: Secondary | ICD-10-CM

## 2012-07-14 DIAGNOSIS — F411 Generalized anxiety disorder: Secondary | ICD-10-CM

## 2012-07-14 NOTE — Progress Notes (Signed)
    Daily Group Progress Note  Program: IOP  Group Time: 9:00-10:30 am   Participation Level: Active  Behavioral Response: Appropriate  Type of Therapy:  Process Group  Summary of Progress: Patient was new to the group today. She jumped into the conversation prior to introductions and appeared comfortable in a group setting.      Group Time: 10:30 am - 12:00 pm   Participation Level:  Active  Behavioral Response: Appropriate  Type of Therapy: Psycho-education Group  Summary of Progress: Patient learned the DBT skill of ACCEPTS with distress tolerance to learn how to manage negative emotions.   Maxcine Ham, MSW, LCSW
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
## 2012-07-15 ENCOUNTER — Ambulatory Visit (HOSPITAL_COMMUNITY): Payer: Self-pay

## 2012-07-15 ENCOUNTER — Encounter (HOSPITAL_COMMUNITY): Payer: Self-pay | Admitting: Psychology

## 2012-07-15 ENCOUNTER — Other Ambulatory Visit (HOSPITAL_COMMUNITY): Payer: 59 | Attending: Psychiatry | Admitting: Psychology

## 2012-07-15 DIAGNOSIS — F1210 Cannabis abuse, uncomplicated: Secondary | ICD-10-CM | POA: Insufficient documentation

## 2012-07-15 DIAGNOSIS — F411 Generalized anxiety disorder: Secondary | ICD-10-CM | POA: Insufficient documentation

## 2012-07-15 DIAGNOSIS — F332 Major depressive disorder, recurrent severe without psychotic features: Secondary | ICD-10-CM | POA: Insufficient documentation

## 2012-07-16 ENCOUNTER — Ambulatory Visit (HOSPITAL_COMMUNITY): Payer: Self-pay

## 2012-07-17 ENCOUNTER — Other Ambulatory Visit (HOSPITAL_COMMUNITY): Payer: 59

## 2012-07-17 ENCOUNTER — Ambulatory Visit (HOSPITAL_COMMUNITY): Payer: Self-pay
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
## 2012-07-17 NOTE — Progress Notes (Signed)
    Daily Group Progress Note  Program: CD-IOP   Group Time: 1-2:30 pm  Participation Level: Active  Behavioral Response: Appropriate  Type of Therapy: Activity Group  Topic: "Draw Your Life": An activity was provided in the first half of group with paper and crayons, pencils and colored markers distributed among group members. The assignment was to draw 4 pictures that represent: 1) what your life looked like 10 years ago, 2) what your life looked like 1 year ago, 3) what it looks like now, and 4) what you intend it to look like 5 years from now. The room was quite while members worked on the assignment. When all members were done drawing, they volunteered to stand up before the group and explain their drawings. The pictures were very moving and, in every case, the pictures of their current lives were painful and lonely. In each instance, the future pictures were hopeful and displayed more joy and fulfillment. The exercise provided very insightful and touched a part of each member not usually displayed in session.  Group Time: 2:45- 4pm  Participation Level: Active  Behavioral Response: Sharing  Type of Therapy: Process Group  Topic: "Draw Your Life": An activity was provided in the first half of group with paper and crayons, pencils and colored markers distributed among group members. The assignment was to draw 4 pictures that represent: 1) what your life looked like 10 years ago, 2) what your life looked like 1 year ago, 3) what it looks like now, and 4) what you intend it to look like 5 years from now. The room was quite while members worked on the assignment. When all members were done drawing, they volunteered to stand up before the group and explain their drawings. The pictures were very moving and, in every case, the pictures of their current lives were painful and lonely. In each instance, the future pictures were hopeful and displayed more joy and fulfillment. The exercise provided  very insightful and touched a part of each member not usually displayed in session.  Summary: The patient was new to the group and introduced herself. She admitted she had used this morning, but was not high now. She was very open about her drawings and the first 3 segments displayed lots of struggle and pain. The final one - representing hopes for 5 years ahead, was hopeful. The patient reported she had 2 boys and they were great, but sometimes very challenging and she noted that her parents helped her with them. The patient seemed very comfortable in this first session of group and responded well to intervention.    Family Program: Family present? No   Name of family member(s):   UDS collected: No Results:   AA/NA attended?: No, new to the program  Sponsor?: No   Mattix Imhof, LCAS

## 2012-07-20 ENCOUNTER — Ambulatory Visit (HOSPITAL_COMMUNITY): Payer: Self-pay

## 2012-07-20 ENCOUNTER — Other Ambulatory Visit (HOSPITAL_COMMUNITY): Payer: 59

## 2012-07-21 ENCOUNTER — Ambulatory Visit (HOSPITAL_COMMUNITY): Payer: Self-pay

## 2012-07-22 ENCOUNTER — Other Ambulatory Visit (HOSPITAL_COMMUNITY): Payer: 59

## 2012-07-22 ENCOUNTER — Ambulatory Visit (HOSPITAL_COMMUNITY): Payer: Self-pay

## 2012-07-22 ENCOUNTER — Ambulatory Visit (INDEPENDENT_AMBULATORY_CARE_PROVIDER_SITE_OTHER): Payer: 59 | Admitting: Physician Assistant

## 2012-07-22 DIAGNOSIS — F332 Major depressive disorder, recurrent severe without psychotic features: Secondary | ICD-10-CM

## 2012-07-22 DIAGNOSIS — F1210 Cannabis abuse, uncomplicated: Secondary | ICD-10-CM

## 2012-07-22 DIAGNOSIS — F411 Generalized anxiety disorder: Secondary | ICD-10-CM

## 2012-07-22 NOTE — Progress Notes (Signed)
   [facility] Behavioral Health Follow-up Outpatient Visit  Carolyn Rasmussen 06/24/1981  Date: 07/22/12  Subjective: Carolyn Rasmussen presents today to followup on her depression and anxiety. She had been previously referred to the psychiatric intensive outpatient program, but upon evaluation for that program it was found that she had been abusing alcohol and marijuana regularly, and she was referred to the chemical dependency intensive outpatient program. She did attend her first session with that group, but then did not show up for the 2 subsequent groups. Today she reports that she has no desire to stop smoking marijuana, as it helps her to feel better. We discussed the need for her to be abstinent from mind altering substances before effective treatment for her depression and anxiety could be accomplished. Reluctantly, she has agreed to recommit herself to the chemical dependency intensive outpatient program, and will start later this week. She denies any current suicidal or homicidal ideation, although she did have some suicidal ideation 4 days ago, with thoughts of injecting lidocaine or jumping off the parking deck. She assures this provider that she is safe today.  There were no vitals filed for this visit.  Mental Status Examination  Appearance: Fairly groomed and casually dressed Alert: Yes Attention: good  Cooperative: Yes Eye Contact: Poor Speech: Clear and coherent Psychomotor Activity: Normal Memory/Concentration: Intact Oriented: person, place, time/date and situation Mood: Anxious and Depressed Affect: Constricted and Tearful Thought Processes and Associations: Logical Fund of Knowledge: Good Thought Content:  No suicidal ideation today Insight: Fair Judgement: Fair  Diagnosis: Maj. 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  Carolyn Delfino, PA-C

## 2012-07-23 ENCOUNTER — Ambulatory Visit (HOSPITAL_COMMUNITY): Payer: Self-pay

## 2012-07-24 ENCOUNTER — Ambulatory Visit (HOSPITAL_COMMUNITY): Payer: Self-pay

## 2012-07-24 ENCOUNTER — Other Ambulatory Visit (HOSPITAL_COMMUNITY): Payer: 59 | Admitting: Psychology

## 2012-07-26 NOTE — Progress Notes (Signed)
Daily Group Progress Note  Program: CD-IOP   Group Time: 1-2:30 pm  Participation Level: Active  Behavioral Response: Sharing, Rationalizing and Minimizing  Type of Therapy: Process Group  Topic: Process: first part of group was spent in process. A new member had returned after a 3 session absence. Upon check-in, she disclosed she had not drank since July 30th, but admitted she had smoked marijuana this morning. She insisted that although the alcohol was a problem, she did not feel that the marijuana was a problem and it was very effective in helping her with anxiety and panic attacks. This disclosure invited a lengthy discussion about the problems that result when an addict uses any chemical. A number of other group members shared that they had tried to eliminate some drug use, but continue to use the marijuana, but it never worked for long and they were always back to their old ways within a short time. The session was very effective in allowing members to witness 'denial' and address their own issues around the future and what they will be able to do about their sobriety.  Group Time: 2:45- 4pm  Participation Level: Active  Behavioral Response: Sharing  Type of Therapy: Psycho-education Group  Topic:The Wheel of Life/Graduation: the second part of group was spent with members sharing their wheel of life drawing on the board and explaining where they are in each of the 8 categories. Most wheels were extremely uneven and would have led to very bumpy ride. This exercise required that members share about their lives and opened them up to a new level of understanding within the group. As the session neared an end, a graduation ceremony was held for one of the younger members' who was moving onto a new level of recovery. members shared kind words and encouragement to her and she handed out drawings she had made specifically for each member. It was a joyful celebration, but one that also  recognized that her absence will be felt by all.    Summary: The patient returned to the group after having missed 3 consecutive sessions. During check-in, she reported she had not used alcohol since July 30th, but admitted she had smoked marijuana today. When questioned about this by another group member, she admitted she had a problem with alcohol, but felt that the weed was a different thing and it was very helpful to her in many ways. Another member explained that she had had difficulty giving up weed and had always used it, but it was necessary if she was going to remain alcohol-free. I expressed my feeling that it was disrespectful of her to come to this group when she had used and was possibly high. The patient did not seem upset by the feedback from group, but was willing to consider their ideas. She drew her wheel of life on the board and admitted that finances were one of her biggest issues. When questioned about how much she was spending on weed per month, the patient admitted it was around $300. The point was clearly made. The patient shared openly about her concerns, including her children and her frustrations in parenting them. She responded well to this intervention, but was informed that continued marijuana use will eventually cause her to be discharged from the program and that Jorje Guild, [place] will not work with her on an outpatient basis. A UA will be collected on Monday.    Family Program: Family present? No   Name of family member(s):  UDS collected: No Results:   AA/NA attended?: No  Sponsor?: No   Saahir Prude, LCAS

## 2012-07-27 ENCOUNTER — Ambulatory Visit (HOSPITAL_COMMUNITY): Payer: Self-pay

## 2012-07-27 ENCOUNTER — Other Ambulatory Visit (HOSPITAL_COMMUNITY): Payer: 59 | Admitting: Psychology

## 2012-07-27 DIAGNOSIS — F1920 Other psychoactive substance dependence, uncomplicated: Secondary | ICD-10-CM

## 2012-07-28 ENCOUNTER — Ambulatory Visit (HOSPITAL_COMMUNITY): Payer: Self-pay

## 2012-07-28 LAB — PRESCRIPTION ABUSE MONITORING 17P, URINE
Amphetamine/Meth: NEGATIVE ng/mL
Barbiturate Screen, Urine: NEGATIVE ng/mL
Benzodiazepine Screen, Urine: NEGATIVE ng/mL
Carisoprodol, Urine: NEGATIVE ng/mL
Fentanyl, Ur: NEGATIVE ng/mL
Meperidine, Ur: NEGATIVE ng/mL
Oxycodone Screen, Ur: NEGATIVE ng/mL
Propoxyphene: NEGATIVE ng/mL

## 2012-07-28 NOTE — Progress Notes (Signed)
    Daily Group Progress Note  Program: CD-IOP   Group Time: 1-2:30 pm  Participation Level: Active  Behavioral Response: Appropriate  Type of Therapy: Psycho-education Group  Topic: Early Recovery and Resistance from Loved Ones: first half of group was spent in a presentation on the problems in early recovery when family and loved ones resist or challenge the changes their loved ones are making. Two members complained about the struggles they are encountering from their wives and resistance to the changes they are attempting to make in early sobriety. This session emphasized the changes that all must make when the addicted person enters recovery and begins making changes. This necessitates the changes that others must make to accommodate the initial changes of their loved ones. The final emphasis was placed on the need to maintain sobriety despite the actions or reactions of family, friends and loved ones.  Group Time: 2:45- 4 pm  Participation Level: Active  Behavioral Response: Sharing  Type of Therapy: Process Group  Topic:Process: Second half of group was spent in process. Members discussed their struggles and challenges and what they are currently dealing with in their recovery. A new member had joined the group and talked briefly about her addiction. 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 She had never had a 'real' job. There was good disclosure and feedback among the group and the new member was provided with AA/NA schedules and phone numbers of the group.   Summary:The patient reported she had not smoked today, which was the first session in 3 that she had not used cannabis on the same day as her afternoon group session. She stated she had had a good weekend and celebrated her 59 yo son's birthday, but had worked the weekend from 7am-7 pm on Saturday and Sunday. I noted  she looked more awake and other group members agreed with this observation. The patient displayed poor insight into why she smokes cannabis and was encouraged to consider what her life might look like in order not to get high or want to 'check out'. The patient provided support to the members complaining about their wives and agreed they were being very unreasonable. The patient appeared today, which was uncertain, and she stated her intention to attend an AA meeting tomorrow near her home in [place].    Family Program: Family present? No   Name of family member(s):   UDS collected: Yes Results: positive for marijuana  AA/NA attended?: No, but stated her intention to attend an AA meeting tomorrow evening at a church near her home in [facility]?: No   Ruba Outen, LCAS

## 2012-07-29 ENCOUNTER — Other Ambulatory Visit (HOSPITAL_COMMUNITY): Payer: 59 | Admitting: Psychology

## 2012-07-29 ENCOUNTER — Ambulatory Visit (HOSPITAL_COMMUNITY): Payer: Self-pay

## 2012-07-30 ENCOUNTER — Ambulatory Visit (HOSPITAL_COMMUNITY): Payer: Self-pay

## 2012-07-30 LAB — CANNABANOIDS (GC/LC/MS), URINE: THC-COOH (GC/LC/MS), ur confirm: 1872 ng/mL

## 2012-07-31 ENCOUNTER — Other Ambulatory Visit (HOSPITAL_COMMUNITY): Payer: 59

## 2012-07-31 ENCOUNTER — Ambulatory Visit (HOSPITAL_COMMUNITY): Payer: Self-pay

## 2012-07-31 NOTE — Progress Notes (Signed)
    Daily Group Progress Note  Program: CD-IOP   Group Time: 1-2:30 pm  Participation Level: Active  Behavioral Response: Sharing, minimizing, rationalizing  Type of Therapy: Psycho-education Group  Topic: What are Your Mooring Lines? A presentation was provided identifying the essential need to develop mooring lines to keep on anchored and steady in recovery. An analogy to a boat or ship that sets out mooring lines to remain anchored. Without them the boat will drift and possibly encounter danger or damage by drifting aimlessly. Early recovery is the same way. If one doesn't have these support lines in place, the risks of relapse become greater and more likely. Handouts were provided with members identifying their mooring lines. Most had a few, but only 1 or 2 group members have built the support and established these mooring lines to support their early sobriety.   Group Time: 2:45- 4pm  Participation Level: Active  Behavioral Response: Sharing  Type of Therapy: Process Group  Topic: Process: second half of group was spent in process. Members shared about their difficulties and struggles. One member shared about his situation at home and the growing chaos around his marriage and wife's behaviors. Members listened and provided support, but everyone was clearly frustrated by a bad situation getting worse and the few options available to this member. There was denial observed by one member who discounts her cannabis use while others encouraged their fellow group members to attend meetings and reach out for support.  Summary: The patient checked-in with a sobriety date of today - the 21rst. She explained that she has remained alcohol-free and had not smoked pot on Monday, but did use last night. She explained she had been in class at [facility] all day and used along with her boyfriend in the evening. The patient suggested she had been bored, but the group seemed troubled by her lack of effort to  resist or fight the urge to smoke and questioned whether she intends to stop using? She agreed that she needs to, but her resistance manifested in minimizing and redirecting. She suggested that because she has a busy semester taking form she is less likely to use. This seemed like a hollow argument. When asked about AA attendance on Tuesday evening as she had previously reported, the patient admitted she had not gone after she saw the meeting was about literature and she didn't want to hear about God. The group quickly explained what the literature was, including the Big Book and other AA readings. She stated she would attend a meeting tomorrow evening at the church near her home in [place]. Will continue to follow closely and re-emphasize the abstinence-based nature of this program.  Family Program: Family present? No   Name of family member(s):   UDS collected: No Results:   AA/NA attended?: No  Sponsor?: No   Briseida Gittings, LCAS

## 2012-08-03 ENCOUNTER — Ambulatory Visit (HOSPITAL_COMMUNITY): Payer: Self-pay

## 2012-08-03 ENCOUNTER — Other Ambulatory Visit (HOSPITAL_COMMUNITY): Payer: 59

## 2012-08-04 ENCOUNTER — Ambulatory Visit (HOSPITAL_COMMUNITY): Payer: Self-pay

## 2012-08-04 ENCOUNTER — Encounter (HOSPITAL_COMMUNITY): Payer: Self-pay | Admitting: Psychology

## 2012-08-04 NOTE — Progress Notes (Signed)
Patient ID: Carolyn Rasmussen, female   DOB: 1981-10-06, 31 y.o.   MRN: [id number] The patient did not appear for group yesterday nor did she phone to explain her absence. I phoned her this afternoon to inquire about her well-being and left a voice mail message. I also phoned her mother, Otila Kluver, and left a message informing her of Jenn's absence. The patient had missed group on Friday and had left a message stating she had hurt her ankle helping a friend move on Friday morning. I had informed her mother of this message, but she had questioned its validity as well.  Will wait to hear from this patient.

## 2012-08-05 ENCOUNTER — Other Ambulatory Visit (HOSPITAL_COMMUNITY): Payer: 59

## 2012-08-05 ENCOUNTER — Ambulatory Visit (HOSPITAL_COMMUNITY): Payer: Self-pay

## 2012-08-05 NOTE — Progress Notes (Signed)
Patient ID: Carolyn Rasmussen, female   DOB: [birth year], 31 y.o.   MRN: [id number] Near the end of the work day the patient phoned me. Carolyn Rasmussen explained she had spoken with her parents this past weekend and her father had told her we were going to kick her out of the program. She reported she had made some calls and has scheduled an appointment with a therapist at [facility]. I explained that I have not even spoken with her father, but that we did not have any intention of kicking Rox out of the program, but hoped she would learn from group and that instead of smoking marijuana, she would develop some alternative coping skills to stress and boredom. I wondered why she hadn't bothered to contact me directly to verify her father's report, but she offered not reasoning. She reminded me that she had sought treatment to address her depression and anxiety and that these had been problems for her long before the alcohol and marijuana use began. She insisted that if the depression and anxiety were addressed the other issues would resolve themselves. I explained that it was clear that she had a problem with the cannabis and the alcohol and would benefit from the CD-IOP program. I even suggested that once she completed this program, she might consider entering the Psych-IOP to learn more coping skills around her anxiety. The patient reported she was really to busy at this time to attend the afternoon program with school and work. She thanked me, but stated she would not return to the program and seek assistance elsewhere.

## 2012-08-06 ENCOUNTER — Encounter (HOSPITAL_COMMUNITY): Payer: Self-pay | Admitting: Psychology

## 2012-08-06 ENCOUNTER — Telehealth (HOSPITAL_COMMUNITY): Payer: Self-pay | Admitting: *Deleted

## 2012-08-06 ENCOUNTER — Ambulatory Visit (HOSPITAL_COMMUNITY): Payer: Self-pay

## 2012-08-06 NOTE — Progress Notes (Signed)
Patient ID: EMOREE SASAKI, female   DOB: Apr 18, 1981, 31 y.o.   MRN: [id number] After leaving a message for Korynne, I phoned her mother, Otila Kluver. I left a message, but shee returned the call within a few minutes. Olegario Messier explained she was still as school, but class was over for the day. Olegario Messier is an Tourist information centre manager. I expressed our concerns about her daughter and the decision she had made to drop out of our program. Olegario Messier confirmed that Doneta had told her about not returning and was aware that she had scheduled an appointment with a counselor at [facility], but she didn't know the name of the counselor. I explained I had wanted to talk to Dilpreet in person and had asked her to phone me and Olegario Messier reported she was probably in class at [facility] until 5 pm. I explained that we were also concerned about Deira and any thoughts she might have of hurting herself. I noted she had described her plan to the medical director of the CD-IOP and also told me. She denied having any thoughts of self-harm when she described her plan, but we were still concerned and wanted to speak with her about this. Olegario Messier reported she would speak with her husband and noted that he and her daughter are going car shopping tomorrow at [employer] and will have time together. I informed Olegario Messier that we had scheduled an appointment for [facility] with a psychiatrist and provided her with his name, the date, time and phone number. She agreed to discuss this with her daughter and confirmed that she would have to have someone prescribing her medications. She thanked me and our conversation ended.

## 2012-08-06 NOTE — Progress Notes (Signed)
Patient ID: Carolyn Rasmussen, female   DOB: 27-Aug-1981, 31 y.o.   MRN: [id number] I phoned Carolyn Rasmussen this afternoon to discuss our concerns for her and the referral we have made to Dr. Thedore Mins at the [facility] in [place]. She will need someone to monitor and prescribe the medications she is taking. Because the patient has refused to stop smoking cannabis, and dropped out of the CD-IOP, her current provider, Carolyn Guild, [place], will no longer work with her. He had met with the patient while she was in the program and had explained his unwillingness to continue to meet with her if she continues using alcohol and drugs. I expressed our concerns for her and asked her to return the call today. I will provide her with the date and time (Thursday, September 5th at 10:30 am) of her scheduled appointment with Dr. [name]. Will wait to hear from her.

## 2012-08-06 NOTE — Progress Notes (Signed)
Patient ID: Carolyn Rasmussen, female   DOB: 11/13/1981, 31 y.o.   MRN: [id number] Discussed pt during treatment team meeting and confirmed that if she refuses our CDIOP group/care (as she informed us this week) then we will provide referral for her for medication management. Pt informed [name] that she already has a new counseling appt set up for herself at [facility] Set up appt for pt at [facility] with Dr. Jannifer Franklin, MD. Appointment is scheduled for Thursday September 5 at 10:30 am. [name] will call pt to inform her of scheduled appt and also [name], office manager at [facility] will call pt to confirm.

## 2012-08-07 ENCOUNTER — Other Ambulatory Visit (HOSPITAL_COMMUNITY): Payer: 59

## 2012-08-07 ENCOUNTER — Encounter (HOSPITAL_COMMUNITY): Payer: Self-pay | Admitting: Psychology

## 2012-08-07 ENCOUNTER — Ambulatory Visit (HOSPITAL_COMMUNITY): Payer: Self-pay

## 2012-08-11 ENCOUNTER — Ambulatory Visit (HOSPITAL_COMMUNITY): Payer: Self-pay

## 2012-08-12 ENCOUNTER — Other Ambulatory Visit (HOSPITAL_COMMUNITY): Payer: 59 | Attending: Psychiatry

## 2012-08-12 ENCOUNTER — Encounter: Payer: Self-pay | Admitting: Family Medicine

## 2012-08-12 ENCOUNTER — Ambulatory Visit (INDEPENDENT_AMBULATORY_CARE_PROVIDER_SITE_OTHER): Payer: 59 | Admitting: Family Medicine

## 2012-08-12 VITALS — BP 105/75 | HR 126 | Temp 99.2°F | Ht 64.75 in | Wt 169.4 lb

## 2012-08-12 DIAGNOSIS — J309 Allergic rhinitis, unspecified: Secondary | ICD-10-CM | POA: Insufficient documentation

## 2012-08-12 DIAGNOSIS — F330 Major depressive disorder, recurrent, mild: Secondary | ICD-10-CM

## 2012-08-12 MED ORDER — DULOXETINE HCL 30 MG PO CPEP: ORAL_CAPSULE | ORAL | Status: DC

## 2012-08-12 MED ORDER — LAMOTRIGINE 100 MG PO TABS: 100.0000 mg | ORAL_TABLET | Freq: Every day | ORAL | Status: DC

## 2012-08-12 NOTE — Progress Notes (Signed)
  Subjective:    Patient ID: Carolyn Rasmussen, female    DOB: 1981-04-11, 31 y.o.   MRN: [id number]  HPI Depression- has appt w/ psychologist upcoming.  No longer going to [facility], 'they kinda kicked me out'.  Was in intensive outpt program for depression and they wanted her to start chemical dependency program due to ETOH blackouts and marijuana use.  Has been ETOH free x30 days, still smoking marijuana, doesn't feel this is a problem.  Pt feels mood is currently well controlled- felt mood improved once she stopped drinking.  Things came to a head 3 months when she started tx.  Prior to this, broke up w/ bf in Feb, had financial trouble, was working 60 hrs/week, kids were always w/ her parents- 'i just kinda lost it'.  Cough- pt feels this is due to recently starting smoking.  Cough is productive of thick, yellow/clear sputum.  No fevers.  sxs x2 months.  mucinex helps.  Has hx of seasonal allergies.  Some fluid in L ear.  Not currently on allergy meds.   Review of Systems     Objective:   Physical Exam  Vitals reviewed. Constitutional: She is oriented to person, place, and time. She appears well-developed and well-nourished. No distress.  HENT:  Head: Normocephalic and atraumatic.  Right Ear: Tympanic membrane normal.  Left Ear: Tympanic membrane normal.  Nose: Mucosal edema and rhinorrhea present. Right sinus exhibits no maxillary sinus tenderness and no frontal sinus tenderness. Left sinus exhibits no maxillary sinus tenderness and no frontal sinus tenderness.  Mouth/Throat: Mucous membranes are normal. Posterior oropharyngeal erythema (w/ PND) present.  Eyes: Conjunctivae and EOM are normal. Pupils are equal, round, and reactive to light.  Neck: Normal range of motion. Neck supple.  Cardiovascular: Normal rate, regular rhythm and normal heart sounds.   Pulmonary/Chest: Effort normal and breath sounds normal. No respiratory distress. She has no wheezes. She has no rales.    Lymphadenopathy:    She has no cervical adenopathy.  Neurological: She is alert and oriented to person, place, and time.  Psychiatric: Her behavior is normal.       Flat affect          Assessment & Plan:

## 2012-08-12 NOTE — Assessment & Plan Note (Signed)
New.  Start daily OTC antihistamine.  This is likely contributing to cough.  Also encouraged her to stop smoking as this is common irritant.  Reviewed supportive care and red flags that should prompt return.  Pt expressed understanding and is in agreement w/ plan.

## 2012-08-12 NOTE — Patient Instructions (Addendum)
Make sure you call me if your mood is not well controlled Continue seeing your therapist CONGRATS on quitting drinking! STOP smoking! Start claritin or zyrtec daily for the post nasal drip and seasonal allergies Call with any questions or concerns Hang in there!!

## 2012-08-12 NOTE — Assessment & Plan Note (Signed)
Deteriorated.  Pt recently w/ ETOH problem and in intensive outpt tx for depression.  No longer seeing Behavioral Health.  Needs med refills.  Discussed option of establishing w/ new psych but pt feels sxs are currently stable on med regimen.  Discussed importance of not self medicating w/ ETOH or drugs.  Encouraged her to stop smoking marijuana.  Will follow.

## 2012-08-14 ENCOUNTER — Other Ambulatory Visit (HOSPITAL_COMMUNITY): Payer: 59

## 2012-08-17 ENCOUNTER — Other Ambulatory Visit (HOSPITAL_COMMUNITY): Payer: 59

## 2012-08-19 ENCOUNTER — Ambulatory Visit (INDEPENDENT_AMBULATORY_CARE_PROVIDER_SITE_OTHER): Payer: 59 | Admitting: Psychology

## 2012-08-19 ENCOUNTER — Other Ambulatory Visit (HOSPITAL_COMMUNITY): Payer: 59

## 2012-08-19 DIAGNOSIS — F1920 Other psychoactive substance dependence, uncomplicated: Secondary | ICD-10-CM

## 2012-08-19 DIAGNOSIS — F411 Generalized anxiety disorder: Secondary | ICD-10-CM

## 2012-08-21 ENCOUNTER — Other Ambulatory Visit (HOSPITAL_COMMUNITY): Payer: 59

## 2012-08-24 ENCOUNTER — Other Ambulatory Visit (HOSPITAL_COMMUNITY): Payer: 59

## 2012-08-26 ENCOUNTER — Other Ambulatory Visit (HOSPITAL_COMMUNITY): Payer: 59

## 2012-08-27 ENCOUNTER — Ambulatory Visit (INDEPENDENT_AMBULATORY_CARE_PROVIDER_SITE_OTHER): Payer: 59 | Admitting: Psychology

## 2012-08-27 DIAGNOSIS — F4010 Social phobia, unspecified: Secondary | ICD-10-CM

## 2012-08-27 DIAGNOSIS — F331 Major depressive disorder, recurrent, moderate: Secondary | ICD-10-CM

## 2012-08-28 ENCOUNTER — Other Ambulatory Visit (HOSPITAL_COMMUNITY): Payer: 59

## 2012-08-31 ENCOUNTER — Other Ambulatory Visit (HOSPITAL_COMMUNITY): Payer: 59

## 2012-09-02 ENCOUNTER — Other Ambulatory Visit (HOSPITAL_COMMUNITY): Payer: 59

## 2012-09-04 ENCOUNTER — Other Ambulatory Visit (HOSPITAL_COMMUNITY): Payer: 59

## 2012-09-07 ENCOUNTER — Other Ambulatory Visit (HOSPITAL_COMMUNITY): Payer: 59

## 2012-09-08 NOTE — Progress Notes (Signed)
Patient ID: Carolyn Rasmussen, female   DOB: 08-23-1981, 31 y.o.   MRN: [id number] Patient did not return to treatment in the CD-IOP and is discharged effective August 07, 2012. Please see discharge summary and discharge plan from 08/07/2012.

## 2012-09-09 ENCOUNTER — Other Ambulatory Visit (HOSPITAL_COMMUNITY): Payer: 59

## 2012-09-09 ENCOUNTER — Ambulatory Visit: Payer: Self-pay | Admitting: Psychology

## 2012-10-15 ENCOUNTER — Ambulatory Visit (INDEPENDENT_AMBULATORY_CARE_PROVIDER_SITE_OTHER): Payer: 59 | Admitting: Family Medicine

## 2012-10-15 ENCOUNTER — Encounter: Payer: Self-pay | Admitting: Family Medicine

## 2012-10-15 VITALS — BP 126/82 | HR 110 | Temp 98.8°F | Resp 17 | Wt 158.2 lb

## 2012-10-15 DIAGNOSIS — F330 Major depressive disorder, recurrent, mild: Secondary | ICD-10-CM

## 2012-10-15 MED ORDER — CLONAZEPAM 0.5 MG PO TABS: 0.5000 mg | ORAL_TABLET | Freq: Three times a day (TID) | ORAL | Status: DC | PRN

## 2012-10-15 NOTE — Patient Instructions (Addendum)
Follow through on the psych appts Start the Klonopin 2-3x/day as needed for anxiety Continue the Cymbalta and Lamictal Call if you need anything! You can do this!!!

## 2012-10-15 NOTE — Assessment & Plan Note (Signed)
Deteriorated.  Pt now w/ worsening anxiety due to recent life stressors.  Has called psych- waiting on appts.  Would like medication until that appt so that she can continue to work- fearful of losing job.  Will start low dose klonopin and encouraged her to f/u w/ psych.  Pt to call if situation changes or worsens.  Reviewed supportive care and red flags that should prompt return.  Pt expressed understanding and is in agreement w/ plan.

## 2012-10-15 NOTE — Progress Notes (Signed)
  Subjective:    Patient ID: Carolyn Rasmussen, female    DOB: 08-09-1981, 31 y.o.   MRN: [id number]  HPI Anxiety/depression- 'everything kind of built up'.  Boyfriend moved to [place] for a job a couple of weeks ago, is in process of losing house- had to move back in w/ parents.  2 young boys.  Has called several psychiatrists to set up appt.  Has used all of her time off and FMLA- unable to call out w/out being fired. Currently on Cymbalta 90 mg daily.  On Lamictal 100mg .  In the past has taken lorazepam for anxiety.  No SI/HI.  'i'm having a lot of trouble seeing past 1 day at a time right now'.    Review of Systems For ROS see HPI     Objective:   Physical Exam  Vitals reviewed. Constitutional: She appears well-developed and well-nourished. No distress.  Psychiatric: Her behavior is normal. Thought content normal.       Flat, withdrawn          Assessment & Plan:

## 2012-11-23 ENCOUNTER — Emergency Department (HOSPITAL_COMMUNITY)
Admission: EM | Admit: 2012-11-23 | Discharge: 2012-11-24 | Disposition: A | Discharge status: Other Acute Inpatient Hospital | Payer: Self-pay | Attending: Emergency Medicine | Admitting: Emergency Medicine

## 2012-11-23 ENCOUNTER — Encounter (HOSPITAL_COMMUNITY): Payer: Self-pay | Admitting: *Deleted

## 2012-11-23 DIAGNOSIS — F1010 Alcohol abuse, uncomplicated: Secondary | ICD-10-CM | POA: Insufficient documentation

## 2012-11-23 DIAGNOSIS — N189 Chronic kidney disease, unspecified: Secondary | ICD-10-CM | POA: Insufficient documentation

## 2012-11-23 DIAGNOSIS — F1210 Cannabis abuse, uncomplicated: Secondary | ICD-10-CM | POA: Insufficient documentation

## 2012-11-23 DIAGNOSIS — R05 Cough: Secondary | ICD-10-CM | POA: Insufficient documentation

## 2012-11-23 DIAGNOSIS — F3289 Other specified depressive episodes: Secondary | ICD-10-CM | POA: Insufficient documentation

## 2012-11-23 DIAGNOSIS — R45851 Suicidal ideations: Secondary | ICD-10-CM | POA: Insufficient documentation

## 2012-11-23 DIAGNOSIS — F32A Depression, unspecified: Secondary | ICD-10-CM

## 2012-11-23 DIAGNOSIS — F17200 Nicotine dependence, unspecified, uncomplicated: Secondary | ICD-10-CM | POA: Insufficient documentation

## 2012-11-23 DIAGNOSIS — F411 Generalized anxiety disorder: Secondary | ICD-10-CM | POA: Insufficient documentation

## 2012-11-23 DIAGNOSIS — Z79899 Other long term (current) drug therapy: Secondary | ICD-10-CM | POA: Insufficient documentation

## 2012-11-23 DIAGNOSIS — R059 Cough, unspecified: Secondary | ICD-10-CM | POA: Insufficient documentation

## 2012-11-23 DIAGNOSIS — Z8619 Personal history of other infectious and parasitic diseases: Secondary | ICD-10-CM | POA: Insufficient documentation

## 2012-11-23 DIAGNOSIS — K219 Gastro-esophageal reflux disease without esophagitis: Secondary | ICD-10-CM | POA: Insufficient documentation

## 2012-11-23 DIAGNOSIS — F329 Major depressive disorder, single episode, unspecified: Secondary | ICD-10-CM | POA: Insufficient documentation

## 2012-11-23 HISTORY — DX: Herpesviral infection of urogenital system, unspecified: A60.00

## 2012-11-23 LAB — RAPID URINE DRUG SCREEN, HOSP PERFORMED
Barbiturates: NOT DETECTED
Cocaine: NOT DETECTED
Opiates: NOT DETECTED

## 2012-11-23 LAB — CBC WITH DIFFERENTIAL/PLATELET
Basophils Relative: 0 % (ref 0–1)
HCT: 42.3 % (ref 36.0–46.0)
Hemoglobin: 14.5 g/dL (ref 12.0–15.0)
Lymphs Abs: 2.3 10*3/uL (ref 0.7–4.0)
MCH: 30.9 pg (ref 26.0–34.0)
MCHC: 34.3 g/dL (ref 30.0–36.0)
Monocytes Absolute: 0.8 10*3/uL (ref 0.1–1.0)
Monocytes Relative: 9 % (ref 3–12)
Neutro Abs: 5.7 10*3/uL (ref 1.7–7.7)
RBC: 4.7 MIL/uL (ref 3.87–5.11)

## 2012-11-23 LAB — COMPREHENSIVE METABOLIC PANEL
Albumin: 4.1 g/dL (ref 3.5–5.2)
BUN: 16 mg/dL (ref 6–23)
Chloride: 100 mEq/L (ref 96–112)
Creatinine, Ser: 0.71 mg/dL (ref 0.50–1.10)
GFR calc Af Amer: >90 mL/min (ref >90)
GFR calc non Af Amer: >90 mL/min (ref >90)
Glucose, Bld: 106 mg/dL — ABNORMAL HIGH (ref 70–99)
Total Bilirubin: 0.6 mg/dL (ref 0.3–1.2)

## 2012-11-23 LAB — ACETAMINOPHEN LEVEL: Acetaminophen (Tylenol), Serum: <15 ug/mL (ref 10–30)

## 2012-11-23 LAB — SALICYLATE LEVEL: Salicylate Lvl: <2 mg/dL — ABNORMAL LOW (ref 2.8–20.0)

## 2012-11-23 NOTE — ED Notes (Signed)
Dinner tray ordered for pt per request
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
## 2012-11-23 NOTE — ED Notes (Signed)
Notified AC of sitter need adn will not be available till 7p

## 2012-11-23 NOTE — ED Notes (Signed)
Per House Coverage: Ophelia Charter will be available at 1900

## 2012-11-23 NOTE — ED Notes (Signed)
Pt is here with depression and anxiety.  Pt states quit drinking by herself, but continues to smoke pot and did "molly"  Substance twice this week.  PT states she does have suicidal ideations.  Pt just really needs help.
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
## 2012-11-23 NOTE — ED Notes (Signed)
Family at bedside. 

## 2012-11-23 NOTE — ED Notes (Signed)
PT placed in blue scrubs and wanded by security.

## 2012-11-24 ENCOUNTER — Inpatient Hospital Stay (HOSPITAL_COMMUNITY)
Admission: EM | Admit: 2012-11-24 | Discharge: 2012-11-30 | DRG: 885 | Disposition: A | Discharge status: Home / Self Care | Payer: Federal, State, Local not specified - Other | Source: Ambulatory Visit | Attending: Psychiatry | Admitting: Psychiatry

## 2012-11-24 ENCOUNTER — Encounter (HOSPITAL_COMMUNITY): Payer: Self-pay | Admitting: *Deleted

## 2012-11-24 DIAGNOSIS — F1510 Other stimulant abuse, uncomplicated: Secondary | ICD-10-CM | POA: Diagnosis present

## 2012-11-24 DIAGNOSIS — Z3009 Encounter for other general counseling and advice on contraception: Secondary | ICD-10-CM

## 2012-11-24 DIAGNOSIS — Z23 Encounter for immunization: Secondary | ICD-10-CM

## 2012-11-24 DIAGNOSIS — K219 Gastro-esophageal reflux disease without esophagitis: Secondary | ICD-10-CM | POA: Diagnosis present

## 2012-11-24 DIAGNOSIS — J40 Bronchitis, not specified as acute or chronic: Secondary | ICD-10-CM

## 2012-11-24 DIAGNOSIS — H8110 Benign paroxysmal vertigo, unspecified ear: Secondary | ICD-10-CM

## 2012-11-24 DIAGNOSIS — J309 Allergic rhinitis, unspecified: Secondary | ICD-10-CM

## 2012-11-24 DIAGNOSIS — F411 Generalized anxiety disorder: Secondary | ICD-10-CM | POA: Diagnosis present

## 2012-11-24 DIAGNOSIS — N912 Amenorrhea, unspecified: Secondary | ICD-10-CM

## 2012-11-24 DIAGNOSIS — F1210 Cannabis abuse, uncomplicated: Secondary | ICD-10-CM | POA: Diagnosis present

## 2012-11-24 DIAGNOSIS — N189 Chronic kidney disease, unspecified: Secondary | ICD-10-CM | POA: Diagnosis present

## 2012-11-24 DIAGNOSIS — H9209 Otalgia, unspecified ear: Secondary | ICD-10-CM

## 2012-11-24 DIAGNOSIS — F330 Major depressive disorder, recurrent, mild: Principal | ICD-10-CM

## 2012-11-24 DIAGNOSIS — A6000 Herpesviral infection of urogenital system, unspecified: Secondary | ICD-10-CM | POA: Diagnosis present

## 2012-11-24 MED ORDER — ESOMEPRAZOLE MAGNESIUM 40 MG PO CPDR
40.0000 mg | DELAYED_RELEASE_CAPSULE | Freq: Every day | ORAL | Status: DC
  Administered 2012-11-25 – 2012-11-30 (×6): 40 mg via ORAL
  Filled 2012-11-24 (×2): qty 1
  Filled 2012-11-24: qty 14
  Filled 2012-11-24 (×5): qty 1
  Filled 2012-11-24: qty 14

## 2012-11-24 MED ORDER — LAMOTRIGINE 100 MG PO TABS
100.0000 mg | ORAL_TABLET | Freq: Every day | ORAL | Status: DC
  Administered 2012-11-24 – 2012-11-30 (×7): 100 mg via ORAL
  Filled 2012-11-24 (×3): qty 1
  Filled 2012-11-24: qty 14
  Filled 2012-11-24 (×2): qty 1
  Filled 2012-11-24: qty 14
  Filled 2012-11-24 (×3): qty 1

## 2012-11-24 MED ORDER — CLONAZEPAM 0.5 MG PO TABS
0.5000 mg | ORAL_TABLET | Freq: Three times a day (TID) | ORAL | Status: DC | PRN
  Administered 2012-11-24: 0.5 mg via ORAL
  Filled 2012-11-24: qty 1

## 2012-11-24 MED ORDER — LAMOTRIGINE 100 MG PO TABS
100.0000 mg | ORAL_TABLET | Freq: Every day | ORAL | Status: DC
Start: 2012-11-24 — End: 2012-11-24
  Filled 2012-11-24: qty 1

## 2012-11-24 MED ORDER — TRAZODONE HCL 50 MG PO TABS
50.0000 mg | ORAL_TABLET | Freq: Every day | ORAL | Status: DC
  Administered 2012-11-24 – 2012-11-25 (×2): 50 mg via ORAL
  Filled 2012-11-24 (×3): qty 1

## 2012-11-24 MED ORDER — ESOMEPRAZOLE MAGNESIUM 40 MG PO PACK: 40.0000 mg | PACK | Freq: Every day | ORAL | Status: DC

## 2012-11-24 MED ORDER — DULOXETINE HCL 30 MG PO CPEP
30.0000 mg | ORAL_CAPSULE | Freq: Every day | ORAL | Status: DC
  Filled 2012-11-24 (×2): qty 1

## 2012-11-24 MED ORDER — PANTOPRAZOLE SODIUM 40 MG PO TBEC: 80.0000 mg | DELAYED_RELEASE_TABLET | Freq: Every day | ORAL | Status: DC

## 2012-11-24 MED ORDER — VALACYCLOVIR HCL 500 MG PO TABS
500.0000 mg | ORAL_TABLET | Freq: Two times a day (BID) | ORAL | Status: DC
  Administered 2012-11-24 – 2012-11-30 (×12): 500 mg via ORAL
  Filled 2012-11-24: qty 28
  Filled 2012-11-24 (×7): qty 1
  Filled 2012-11-24 (×2): qty 28
  Filled 2012-11-24 (×3): qty 1
  Filled 2012-11-24: qty 28
  Filled 2012-11-24 (×4): qty 1

## 2012-11-24 MED ORDER — PRENATAL MULTIVITAMIN CH
1.0000 | ORAL_TABLET | Freq: Every day | ORAL | Status: DC
  Administered 2012-11-24 – 2012-11-30 (×7): 1 via ORAL
  Filled 2012-11-24 (×2): qty 1
  Filled 2012-11-24 (×2): qty 14
  Filled 2012-11-24 (×6): qty 1

## 2012-11-24 MED ORDER — TRAZODONE HCL 50 MG PO TABS: 50.0000 mg | ORAL_TABLET | Freq: Every day | ORAL | Status: DC

## 2012-11-24 MED ORDER — DULOXETINE HCL 20 MG PO CPEP
40.0000 mg | ORAL_CAPSULE | Freq: Two times a day (BID) | ORAL | Status: DC
  Administered 2012-11-24 – 2012-11-30 (×12): 40 mg via ORAL
  Filled 2012-11-24: qty 2
  Filled 2012-11-24: qty 38
  Filled 2012-11-24: qty 2
  Filled 2012-11-24: qty 38
  Filled 2012-11-24: qty 2
  Filled 2012-11-24: qty 38
  Filled 2012-11-24: qty 2
  Filled 2012-11-24: qty 38
  Filled 2012-11-24 (×10): qty 2

## 2012-11-24 MED ORDER — TRAZODONE HCL 50 MG PO TABS
50.0000 mg | ORAL_TABLET | Freq: Every day | ORAL | Status: DC
  Administered 2012-11-24: 50 mg via ORAL
  Filled 2012-11-24: qty 1

## 2012-11-24 MED ORDER — NICOTINE 14 MG/24HR TD PT24
14.0000 mg | MEDICATED_PATCH | Freq: Every day | TRANSDERMAL | Status: DC
  Administered 2012-11-24 – 2012-11-26 (×3): 14 mg via TRANSDERMAL
  Filled 2012-11-24 (×8): qty 1

## 2012-11-24 MED ORDER — CLONAZEPAM 0.5 MG PO TABS
0.5000 mg | ORAL_TABLET | Freq: Three times a day (TID) | ORAL | Status: DC | PRN
  Administered 2012-11-24 – 2012-11-25 (×2): 0.5 mg via ORAL
  Filled 2012-11-24 (×2): qty 1
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
## 2012-11-24 NOTE — Progress Notes (Signed)
Psychoeducational Group Note  Date:  11/24/2012 Time:  8:00PM  Group Topic/Focus:  Wrap-Up Group:   The focus of this group is to help patients review their daily goal of treatment and discuss progress on daily workbooks.  Participation Level:  Active  Participation Quality:  Appropriate  Affect:  Appropriate  Cognitive:  Alert and Oriented  Insight:  Developing/Improving  Engagement in Group:  Developing/Improving  Additional Comments:  Pt rated her day as a 4. Pt stated that her goal is to work on having a positive mental attitude. Pt stated that she feels as though she can do that by surrounding herself around positive friends, improving her self-esteem, and getting back into her favorite hobbies which are reading and singing. Pt stated that one thing she likes about herself is that she is a caring individual and enjoys helping others.  Kye Silverstein, Randal Buba 11/24/2012, 10:16 PM
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
## 2012-11-24 NOTE — ED Notes (Signed)
Reported to Surveyor, quantity at [facility].  She states that pt can be transported.

## 2012-11-24 NOTE — Progress Notes (Signed)
Patient ID: Carolyn Rasmussen, female   DOB: 02-23-1981, 31 y.o.   MRN: [id number] Patient was admitted ambulatory and voluntarily with increasing depression, suicidal ideation with plan to use lidocaine to overdose until she no longer had access to it.  She says she has been engaging in risky behavior for the las 2 weeks, staying away from home, hanging out with friends, using drugs (MDMA x2) marijuana anytime she can get it and having sex "not with consistent partners".  She has lost her boyfriend who moved in Oct , her job,  and her home.  She is sad, tearful and cooperative.  She has a history of cutting and has had thoughts of cutting again but has not done so.

## 2012-11-24 NOTE — ED Provider Notes (Addendum)
Act team indicates pt accepted to [facility], Dr Calton Golds, bed ready after 9 am. Discussed w Dr Clarene Duke, plan to go to [facility] at 9 am, recheck vitals prior.   Pt awake and alert, sitting upright.  States feels fine. No pain. No faintness or dizziness. bp sl improved, hr 70, afeb.     Suzi Roots, MD 11/24/12 [phone]

## 2012-11-24 NOTE — ED Notes (Signed)
Report given to Berkley, RN 

## 2012-11-24 NOTE — BH Assessment (Signed)
[facility] Assessment Progress Note      Pt has been accepted to Dr. Daleen Bo for inpatient treatment.  All support paperwork completed and faxed to [facility].  Dr. And nursing staff notified and agreeable with disposition.  Awaiting transfer.
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
## 2012-11-24 NOTE — ED Notes (Signed)
MD Denton Lank made aware of pt's BP.

## 2012-11-24 NOTE — Progress Notes (Signed)
[facility] LCSW Group Therapy     Feelings About Diagnosis  1:15         11/24/2012 4:18 PM  Type of Therapy:  Group Therapy  Participation Level:  Active  Participation Quality:  Appropriate and Attentive  Affect:  Appropriate  Cognitive:  Appropriate  Insight:  Engaged  Engagement in Therapy:  Engaged  Modes of Intervention:  Discussion, Exploration, Problem-solving and Support  Summary of Progress/Problems:  Patient advised she had been in denial regarding her diagnosis of substance abuse.  She shared it has been difficult accepting that she smoke far too much THC and although she does not drink on a regular basis, she endorses drinking heavily when she does drink  Carolyn Rasmussen 11/24/2012, 4:18 PM

## 2012-11-25 NOTE — Progress Notes (Signed)
[facility] LCSW Group Therapy      Emotional Regulation 1:15 - 2:30 PM           11/25/2012 2:19 PM  Type of Therapy:  Group Therapy  Participation Level:  Minimal  Participation Quality:  Appropriate  Affect:  Blunted and Depressed  Cognitive:  Appropriate  Insight:  Limited  Engagement in Therapy:  Limited  Modes of Intervention:  Discussion, Exploration, Problem-solving and Support  Summary of Progress/Problems:  Patient attended group with minimal participation.  She shared the button that people push that upsets her is complaining about insignificant things.  Wynn Banker 11/25/2012, 2:19 PM

## 2012-11-25 NOTE — Progress Notes (Signed)
Psychoeducational Group Note  Date:  11/25/2012 Time:  2015  Group Topic/Focus:  Wrap-Up Group:   The focus of this group is to help patients review their daily goal of treatment and discuss progress on daily workbooks.  Participation Level:  Active  Participation Quality:  Sharing  Affect:  Appropriate and Flat  Cognitive:  Appropriate  Insight:  Improving  Engagement in Group:  Developing/Improving  Additional Comments:  Patient shared that she has improved since she has been here at the hospital.  Karenann Mcgrory, Newton Pigg 11/25/2012, 10:31 PM

## 2012-11-25 NOTE — Tx Team (Signed)
Interdisciplinary Treatment Plan Update (Adult)  Date:  11/25/2012  Time Reviewed:  10:07 AM   Progress in Treatment: Attending groups:   Yes   Participating in groups:  Yes Taking medication as prescribed:  Yes Tolerating medication:  Yes Family/Significant othe contact made: Contact will be made with family Patient understands diagnosis:  Yes Discussing patient identified problems/goals with staff: Yes Medical problems stabilized or resolved: Yes Denies suicidal/homicidal ideation:  No but contracts for safety Issues/concerns per patient self-inventory:  Other:   New problem(s) identified:  Reason for Continuation of Hospitalization: Anxiety Depression Medication stabilization Suicidal ideation  Interventions implemented related to continuation of hospitalization:  Medication Management; safety checks q 15 mins  Additional comments:  Estimated length of stay:  2-3 days  Discharge Plan:  Home with outpatient follow up  New goal(s):  Review of initial/current patient goals per problem list:    1.  Goal(s): Eliminate SI/other thoughts of self harm   Met:  No  Target date: d/c  As evidenced by: Patient will no longer endorse SI/HI or other thoughts of self harm.    2.  Goal (s):Reduce depression/anxiety (rated at six today)  Met: No  Target date: d/c  As evidenced by: Patient will rate symptoms at four or below    3.  Goal(s):.stabilize on meds   Met:  No  Target date: d/c  As evidenced by: Patient will report being stabilized on medications - less symptomatic    4.  Goal(s): Refer for outpatient follow up   Met:  No  Target date: d/c  As evidenced by: Follow up appointment scheduled    Attendees: Patient:   11/25/2012 10:07 AM  Physican:  Patrick North, MD 11/25/2012 10:07 AM  Nursing:  Leighton Parody, RN 11/25/2012 10:07 AM   Nursing:   Berneice Heinrich, RN 11/25/2012 10:07 AM   Clinical Social Worker:  Juline Patch, LCSW 11/25/2012  10:07 AM   Other: Tera Helper, PHM-NP 11/25/2012 10:07 AM   Other:  Patton Salles, Clinical Social Worker, LCSW  11/25/2012 10:07 AM Other: Harold Barban, RN     11/25/2012 10:07 AM

## 2012-11-25 NOTE — Progress Notes (Signed)
[facility] Group Notes:  (Counselor/Nursing/MHT/Case Management/Adjunct)  Type of Therapy:  Psychoeducational Skills  Participation Level:  Minimal  Participation Quality:  Appropriate and Attentive  Affect:  Depressed  Cognitive:  Appropriate and Oriented  Insight:  Appropriate  Engagement in Group:  Engaged  Engagement in Therapy:  n/a  Modes of Intervention:  Activity, Discussion, Education, Problem-solving, Rapport Building, Socialization and Support  Summary of Progress/Problems: Jen attended psychoeducational group that focused on using quality time with support systems/individuals to engage in health coping skills. Candise Bowens participated in activity guessing about self and peers. Candise Bowens was quiet but attentive while group discussed who their support systems are, how they can spend positive quality time with them as a coping skills and a way to strengthen their relationship. Candise Bowens was given a homework assignment to find two ways to improve her support systems and twenty activities she can do to spend quality time with her supports.   Wandra Scot 11/25/2012 2:39 PM

## 2012-11-25 NOTE — Progress Notes (Signed)
Pt reports her day started out rough, but has gotten progressively better.  She says she has been attending groups.  She says she did not sleep well last, but the 15 minute check sheet documents that pt slept well last night.  She is animated in conversation.  She is still unsure what she will do at discharge.  She denies SI/HI/AV at this time.  Pt makes her needs known to staff and voices no needs/concerns at this time.  Safety maintained with q15 minute checks.

## 2012-11-25 NOTE — Progress Notes (Signed)
D: Patient appropriate and cooperative with staff. She reported on self inventory sheet that her appetite and ability to pay attention is improving, but energy level is low. Patient rated depression "4" and feelings of hopelessness "7".  A: Scheduled medications administered per MD orders. Support and encouragement provided to patient. Monitor Q15 minute checks for safety.   R: Patient receptive. Denies SI/HI/AVH. Patient remains safe on the unit.

## 2012-11-25 NOTE — Progress Notes (Signed)
Pt observed interacting with peers and participating in unit activities.  Pt attended evening group.  Pt reports that she is doing ok since her admission.  She denies SI/HI/AV with this Clinical research associate.  She is depressed that she had to give up custody of her two children recently.  She says she feels safe here.  She is unsure what she will do when she is discharged from [facility].  Pt makes her needs known to staff.  Pt voices no needs/concerns at this time.  Support/encouragement given.  Safety maintained with q15 minute checks.

## 2012-11-25 NOTE — Progress Notes (Signed)
[facility] LCSW Aftercare Discharge Planning Group Note  8:30-9:30   11/25/2012 11:09 AM  Participation Quality:  Appropriate  Affect:  Depressed  Cognitive:  Appropriate  Insight:  Engaged  Engagement in Group:  Engaged  Modes of Intervention:  Exploration, Problem-solving and Support  Summary of Progress/Problems:  Patient advised of not sleeping well last night.  She endorses SI but able to contract for safety.    Wynn Banker 11/25/2012, 11:09 AM

## 2012-11-26 DIAGNOSIS — F330 Major depressive disorder, recurrent, mild: Principal | ICD-10-CM

## 2012-11-26 MED ORDER — TRAZODONE HCL 50 MG PO TABS
25.0000 mg | ORAL_TABLET | Freq: Every day | ORAL | Status: DC
  Administered 2012-11-26 – 2012-11-29 (×4): 25 mg via ORAL
  Filled 2012-11-26: qty 7
  Filled 2012-11-26: qty 1
  Filled 2012-11-26: qty 7
  Filled 2012-11-26 (×4): qty 1

## 2012-11-26 MED ORDER — GABAPENTIN 100 MG PO CAPS
100.0000 mg | ORAL_CAPSULE | Freq: Every day | ORAL | Status: DC
  Administered 2012-11-26 – 2012-11-29 (×4): 100 mg via ORAL
  Filled 2012-11-26 (×2): qty 1
  Filled 2012-11-26: qty 14
  Filled 2012-11-26 (×2): qty 1
  Filled 2012-11-26: qty 14
  Filled 2012-11-26: qty 1

## 2012-11-26 NOTE — Progress Notes (Signed)
[facility] INPATIENT:  Family/Significant Other Suicide Prevention Education  Suicide Prevention Education:  Education Completed; Iver Nestle, Mother, [phone]  has been identified by the patient as the family member/significant other with whom the patient will be residing, and identified as the person(s) who will aid the patient in the event of a mental health crisis (suicidal ideations/suicide attempt).  With written consent from the patient, the family member/significant other has been provided the following suicide prevention education, prior to the and/or following the discharge of the patient.  The suicide prevention education provided includes the following:  Suicide risk factors  Suicide prevention and interventions  National Suicide Hotline telephone number  [facility] assessment telephone number  [facility] Emergency Assistance 911  [facility] and/or Residential Mobile Crisis Unit telephone number  Request made of family/significant other to:  Remove weapons (e.g., guns, rifles, knives), all items previously/currently identified as safety concern.  Mother reports patient does not have access to gun - Brother has patient's gun in [place].  Remove drugs/medications (over-the-counter, prescriptions, illicit drugs), all items previously/currently identified as a safety concern.  The family member/significant other verbalizes understanding of the suicide prevention education information provided.  The family member/significant other agrees to remove the items of safety concern listed above.  Wynn Banker 11/26/2012, 2:58 PM

## 2012-11-26 NOTE — Progress Notes (Signed)
[facility] INPATIENT:  Family/Significant Other Suicide Prevention Education  Suicide Prevention Education:  Contact Attempts: Carolyn Rasmussen, [phone] has been identified by the patient as the family member/significant other with whom the patient will be residing, and identified as the person(s) who will aid the patient in the event of a mental health crisis.  With written consent from the patient, two attempts were made to provide suicide prevention education, prior to and/or following the patient's discharge.  We were unsuccessful in providing suicide prevention education.  A suicide education pamphlet was given to the patient to share with family/significant other.  Date and time of first attempt12/19/2013 at 12:24 PM Date and time of second attempt:  Carolyn Rasmussen 11/26/2012, 12:24 PM

## 2012-11-26 NOTE — Progress Notes (Signed)
D - Patient active on the unit, attended group karaoke. Patient cooperative with staff, interacting with peers appropriately. Patient continues to rate depression "3" and hopelessness "1." Patient feels as though she had a good day today.  A - Encouragement and support offered through therapeutic conversation. Medications given as ordered. Encouraged patient to speak with staff about any concerns or questions.  R - Safety maintained with Q 15 minute checks. Will continue to monitor patient.

## 2012-11-26 NOTE — Progress Notes (Signed)
Psychoeducational Group Note  Date:  11/26/2012 Time:  1100  Group Topic/Focus:  Healthy Communication:   The focus of this group is to discuss communication, barriers to communication, as well as healthy ways to communicate with others.  Participation Level:  Active  Participation Quality:  Appropriate, Attentive and Supportive  Affect:  Appropriate  Cognitive:  Alert and Appropriate  Insight:  Supportive  Engagement in Group:  Supportive  Additional Comments:    [name] 11/26/2012, 12:05 PM

## 2012-11-26 NOTE — Progress Notes (Signed)
D: Patient cooperative with staff. She reported on self inventory sheet that her appetite is good, energy level is low, and ability to pay attention is improving. Patient rated depression "3" and feelings of hopelessness "1".  A: Scheduled medications administered per MD orders. Support and encouragement provided to patient. Maintain Q15 minute observations for safety.  R: Patient receptive. Denies SI/HI/AVH. Patient remains safe.

## 2012-11-26 NOTE — Progress Notes (Signed)
Nutrition Brief Note  Patient identified on the Malnutrition Screening Tool (MST) Report  Pt meets criteria for severe malnutrition of chronic illness AEB <50% estimated energy intake for at least the past month with 11.8% weight loss in the past 6 months.   Body mass index is 24.96 kg/(m^2). Pt meets criteria for normal weight based on current BMI.   Pt reports typically eating 1 meal/day PTA however been eating better since admission, 3 meals/day. Pt reports 20 pound unintended weight loss since this past summer r/t poor appetite and likely alcohol abuse. Pt getting prenatal multivitamin. Pt currently eating excellent without any nutritional needs.   No nutrition interventions warranted at this time. If nutrition issues arise, please consult RD.   Levon Hedger MS, RD, LDN [phone] Pager [phone] After Hours Pager

## 2012-11-26 NOTE — Progress Notes (Signed)
[facility] MD Progress Note  11/26/2012 10:44 AM Carolyn Rasmussen  MRN:  [id number]  Subjective: "I have been depressed for a long time. But, I have been self medicating myself with alcohol and weed. I made the wrong choices too without thinking it through. I lost my job and my house as a result. Now I'm living with my parents.  Instead of dealing with my issues, I avoid them. Moving in with my parents forced me to start dealing with my issues. Trying to deal with my issues gets me very depressed. Now I'm ready to deal with them. I have been here since Tuesday. My medications has been adjusted. I feel a lot better. I am also able to talk with my friends who do not drink alcohol and or smoke weed. I still cannot sleep well at night. Trazodone gives me a hang over in the mornings".  Diagnosis:   Axis I: Depression, major, recurrent, mild Axis II: Deferred Axis III:  Past Medical History  Diagnosis Date  . Depression   . Allergy   . GERD (gastroesophageal reflux disease)   . Chronic kidney disease 2002    kidney failure secondary to dehy./ibuprof  . Anxiety   . Genital herpes    Axis IV: other psychosocial or environmental problems Axis V: 41-50 serious symptoms  ADL's:  Intact  Sleep: Poor"  Appetite:  Good  Suicidal Ideation:  Denies Homicidal Ideation:  Denies  AEB (as evidenced by): Per patient reports.  Psychiatric Specialty Exam: Review of Systems  Constitutional: Negative.   HENT: Negative.   Eyes: Negative.   Respiratory: Negative.   Cardiovascular: Negative.   Gastrointestinal: Negative.   Genitourinary: Negative.   Musculoskeletal: Negative.   Skin: Negative.   Psychiatric/Behavioral: Positive for depression (Currently being stabilized with medication.) and substance abuse (Hx of). Negative for suicidal ideas, hallucinations and memory loss. The patient is not nervous/anxious.     Blood pressure 95/67, pulse 114, temperature 98.1 F (36.7 C),  temperature source Oral, resp. rate 16, height 5\' 5"  (1.651 m), weight 68.04 kg (150 lb).Body mass index is 24.96 kg/(m^2).  General Appearance: Casual  Eye Contact::  Good  Speech:  Clear and Coherent  Volume:  Normal  Mood:  "I feel much better"  Affect:  Appropriate  Thought Process:  Coherent, Goal Directed and Intact  Orientation:  Full (Time, Place, and Person)  Thought Content:  Rumination  Suicidal Thoughts:  No  Homicidal Thoughts:  No  Memory:  Immediate;   Good Recent;   Good Remote;   Good  Judgement:  Good  Insight:  Good  Psychomotor Activity:  Normal  Concentration:  Good  Recall:  Good  Akathisia:  No  Handed:  Right  AIMS (if indicated):     Assets:  Desire for Improvement  Sleep:  Number of Hours: 6.75    Current Medications: Current Facility-Administered Medications  Medication Dose Route Frequency Provider Last Rate Last Dose  . clonazePAM (KLONOPIN) tablet 0.5 mg  0.5 mg Oral TID PRN Himabindu Ravi, MD   0.5 mg at 11/25/12 1212  . DULoxetine (CYMBALTA) DR capsule 40 mg  40 mg Oral BID Himabindu Ravi, MD   40 mg at 11/26/12 0804  . esomeprazole (NEXIUM) capsule 40 mg  40 mg Oral QAC breakfast Himabindu Ravi, MD   40 mg at 11/26/12 3016  . lamoTRIgine (LAMICTAL) tablet 100 mg  100 mg Oral Daily Himabindu Ravi, MD   100 mg at 11/26/12 0804  . nicotine (NICODERM  CQ - dosed in mg/24 hours) patch 14 mg  14 mg Transdermal Q0600 Himabindu Ravi, MD   14 mg at 11/26/12 0636  . prenatal multivitamin tablet 1 tablet  1 tablet Oral Daily Himabindu Ravi, MD   1 tablet at 11/26/12 0803  . traZODone (DESYREL) tablet 50 mg  50 mg Oral QHS Himabindu Ravi, MD   50 mg at 11/25/12 2138  . valACYclovir (VALTREX) tablet 500 mg  500 mg Oral BID Himabindu Ravi, MD   500 mg at 11/26/12 1610    Lab Results: No results found for this or any previous visit (from the past 48 hour(s)).  Physical Findings: AIMS: Facial and Oral Movements Muscles of Facial Expression: None,  normal Lips and Perioral Area: None, normal Jaw: None, normal Tongue: None, normal,Extremity Movements Upper (arms, wrists, hands, fingers): None, normal Lower (legs, knees, ankles, toes): None, normal, Trunk Movements Neck, shoulders, hips: None, normal, Overall Severity Severity of abnormal movements (highest score from questions above): None, normal Incapacitation due to abnormal movements: None, normal Patient's awareness of abnormal movements (rate only patient's report): No Awareness, Dental Status Current problems with teeth and/or dentures?: No Does patient usually wear dentures?: No  CIWA:    COWS:     Treatment Plan Summary: Daily contact with patient to assess and evaluate symptoms and progress in treatment Medication management  Plan: Decrease Trazodone from 50 mg to 25 mg Q bedtime. Start Neurontin 100 mg Q bedtime for anxiety and sleep. Continue current treatment plan.  Medical Decision Making Problem Points:  Established problem, stable/improving (1), Review of last therapy session (1) and Review of psycho-social stressors (1) Data Points:  Review and summation of old records (2) Review of medication regiment & side effects (2) Review of new medications or change in dosage (2)  I certify that inpatient services furnished can reasonably be expected to improve the patient's condition.   Armandina Stammer I 11/26/2012, 10:44 AM

## 2012-11-26 NOTE — Clinical Social Work Note (Signed)
Living a Balanced Life  1:15-2:30     [facility] LCSW Group Therapy            11/26/2012 3:51 PM    Type of Therapy:  Group Therapy  Participation Level:  Appropriate  Participation Quality:  Appropriate  Affect:  Appropriate  Cognitive:  Attentive Appropriate  Insight:  Engaged  Engagement in Therapy:  Engaged  Modes of Intervention:  Discussion Exploration Problem-Solving Supportive  Summary of Progress/Problems:  Patient listened attentively to speaker from [employer].  She shared living a balanced life for her would be spending more time with family as well a friend.  Wynn Banker 11/26/2012 3:51 PM

## 2012-11-26 NOTE — Progress Notes (Signed)
Psychoeducational Group Note  Date:  11/26/2012 Time:  1000  Group Topic/Focus:  Self Esteem Action Plan:   The focus of this group is to help patients create a plan to continue to build self-esteem after discharge.  Participation Level:  Active  Participation Quality:  Appropriate, Attentive, Sharing and Supportive  Affect:  Appropriate  Cognitive:  Appropriate  Insight:  Engaged  Engagement in Group:  Engaged  Additional Comments:  Carolyn Rasmussen attended group and shared. Patient was asked to define self-esteem. Afterwards patient was asked what their self esteem level was like and if it was high or low self-esteem. Patient was asked to complete self esteem action plan. Patient completed and answered among the group on what ways to increase self- esteem and activities to complete self esteem. Patient discussed what were difficult and easy ways to complete the workbook.    [name] 11/26/2012, 11:02 AM

## 2012-11-26 NOTE — Clinical Social Work Note (Signed)
[facility] LCSW Aftercare Discharge Planning Group Note       8:30-9:30 AM   11/26/2012 10:34 AM   Participation Quality:  Appropriate  Affect:  Appropriate  Cognitive:  Appropriate  Insight:  Engaged  Engagement in Group:  Engaged  Modes of Intervention:  Education, Exploration, Problem-solving and Support  Summary of Progress/Problems:  Patient reports being better today but endorsing difficulty sleeping.  She rates depression at three and all other symptoms at one.    Wynn Banker 11/26/2012 10:34 AM

## 2012-11-27 MED ORDER — INFLUENZA VIRUS VACC SPLIT PF IM SUSP
0.5000 mL | INTRAMUSCULAR | Status: AC
  Administered 2012-11-28: 0.5 mL via INTRAMUSCULAR

## 2012-11-27 NOTE — Progress Notes (Signed)
Pt appears very anxious and keeps coming up to the med window asking for a flu shot.nformed pt it needs to be ordered by the MD. She has been attending groups and contracts for safety. 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 No Si or HI and contracts for safety.

## 2012-11-27 NOTE — Progress Notes (Signed)
Psychoeducational Group Note  Date:  11/27/2012 Time:  1000  Group Topic/Focus:  Wellness Toolbox:   The focus of this group is to discuss various aspects of wellness, balancing those aspects and exploring ways to increase the ability to experience wellness.  Patients will create a wellness toolbox for use upon discharge.  Participation Level:  Active  Participation Quality:  Appropriate, Sharing and Supportive  Affect:  Appropriate  Cognitive:  Appropriate  Insight:  Supportive  Engagement in Group:  Supportive  Additional Comments:  none  Ernestene Coover M 11/27/2012, 2:02 PM  

## 2012-11-27 NOTE — Progress Notes (Signed)
Psychoeducational Group Note  Date:  11/27/2012 Time:  2000  Group Topic/Focus:  Karaoke night   Participation Level:  Active  Participation Quality:  Appropriate  Affect:  Appropriate   Cognitive:  Appropriate  Insight:  Engaged  Engagement in Group:  Engaged  Additional Comments:   Pt attended karaoke group and participated.   Tagan Bartram A 11/27/2012, 3:33 AM

## 2012-11-27 NOTE — Progress Notes (Signed)
[facility] LCSW Aftercare Discharge Planning Group Note  11/27/2012 2:25 PM  Participation Quality:  Appropriate, Attentive and Sharing  Affect:  Bright  Cognitive:  Alert, Appropriate and Oriented  Insight:  Improving  Engagement in Group:  Engaged  Modes of Intervention:  Discussion  Summary of Progress/Problems:  Briza attended group this morning and reports she is feeling much better than she did the day before. She reports she has enjoyed the groups and the information packets she gets each day to ready and complete. Her depression is ranked at a zero, along with anxiety. Her plan remains to follow up at the [facility] the [place]. Reports her medication is expensive and does need help with this, in which another member of the group reports she can do this.  She reports she will be living in the same place as before and will have transportation. No current needs or barriers at this time.   Nail, Catalina Gravel 11/27/2012, 2:25 PM

## 2012-11-27 NOTE — Clinical Social Work Note (Signed)
[facility] LCSW Group Therapy  11/27/2012 6:33 PM  Type of Therapy:  Group Therapy  Participation Level:  Active  Participation Quality:  Appropriate  Affect:  Appropriate  Cognitive:  Alert, Appropriate and Oriented  Insight:  Good  Engagement in Therapy:  Engaged  Modes of Intervention:  Exploration, Problem-solving and Support  Summary of Progress/Problems:  Focus of group therapy session was to identify what balance would look like for individual patients after discharge.  Patients choose from group of photographs a photo that had meaning for them in reference to balance. Carolyn Rasmussen chose a photo of a person singing and dancing in the rain and shared that it represented a sense of being 'carefree' for her.  Pt could not recall last time she has felt carefree other than when inpatient setting.   Carolyn Rasmussen 11/27/2012, 6:33 PM

## 2012-11-27 NOTE — Progress Notes (Signed)
Patient attended group this evening and participated. Patient reported that she was proud of herself because she was able to regulate her emotions properly after she spoke with a Child psychotherapist about possible discharge and it didn't happen. Patients goal is to hopefully sleep better that she did on last night. Patient plans to work on a better supports system for herself once discharged. Support and encouragement offered. Patient denies si/hi/a/v hallucinations, safety maintained on unit with 15 min checks, will continue to monitor.

## 2012-11-27 NOTE — Progress Notes (Addendum)
Patient ID: Carolyn Rasmussen, female   DOB: 01/25/1981, 31 y.o.   MRN: [id number] [facility] MD Progress Note  11/27/2012 11:38 AM ALETTA EDMUNDS  MRN:  [id number]  Subjective: "I feel like I was on a downward spiral and using THC and alcohol to mask the pain.  I was hanging around the wrong people (the bad people) who didn't really care about me".  I am feeling better now, I can talk to the people who care about me the ones who are not drinking and doing drugs".  "Group has really been helpful.  I thought I was alone; now I know there are others like me.  In group you feel you can reach out and not be judged, the support is really helpful".    Diagnosis:   Axis I: Depression, major, recurrent, mild Axis II: Deferred Axis III:  Past Medical History  Diagnosis Date  . Depression   . Allergy   . GERD (gastroesophageal reflux disease)   . Chronic kidney disease 2002    kidney failure secondary to dehy./ibuprof  . Anxiety   . Genital herpes    Axis IV: other psychosocial or environmental problems Axis V: 41-50 serious symptoms  ADL's:  Intact  Sleep: Poor" Improving.  Medication adjustment of Trazodone and Neurontin helped.  "I did wake around 3 AM; but when I woke up this morning I felt rested and not tired".    Appetite:  Good  Suicidal Ideation:  Denies  Homicidal Ideation:  Denies  AEB (as evidenced by): Per patient reports.  Psychiatric Specialty Exam: Review of Systems  Psychiatric/Behavioral: Positive for depression (Currently being stabilized with medication.) and substance abuse (Hx of). Negative for suicidal ideas, hallucinations and memory loss. The patient is not nervous/anxious.   All other systems reviewed and are negative.    Blood pressure 110/77, pulse 77, temperature 98.1 F (36.7 C), temperature source Oral, resp. rate 12, height 5\' 5"  (1.651 m), weight 68.04 kg (150 lb).Body mass index is 24.96 kg/(m^2).  General Appearance: Casual  Eye Contact::   Good  Speech:  Clear and Coherent  Volume:  Normal  Mood:  "I'm feeling better"  Affect:  Appropriate  Thought Process:  Coherent, Goal Directed and Intact  Orientation:  Full (Time, Place, and Person)  Thought Content:  Rumination  Suicidal Thoughts:  No  Homicidal Thoughts:  No  Memory:  Immediate;   Good Recent;   Good Remote;   Good  Judgement:  Good  Insight:  Good  Psychomotor Activity:  Normal  Concentration:  Good  Recall:  Good  Akathisia:  No  Handed:  Right  AIMS (if indicated):     Assets:  Desire for Improvement  Sleep:  Number of Hours: 5.75    Current Medications: Current Facility-Administered Medications  Medication Dose Route Frequency Provider Last Rate Last Dose  . clonazePAM (KLONOPIN) tablet 0.5 mg  0.5 mg Oral TID PRN Himabindu Ravi, MD   0.5 mg at 11/25/12 1212  . DULoxetine (CYMBALTA) DR capsule 40 mg  40 mg Oral BID Himabindu Ravi, MD   40 mg at 11/27/12 0840  . esomeprazole (NEXIUM) capsule 40 mg  40 mg Oral QAC breakfast Himabindu Ravi, MD   40 mg at 11/27/12 0618  . gabapentin (NEURONTIN) capsule 100 mg  100 mg Oral QHS Sanjuana Kava, NP   100 mg at 11/26/12 2142  . lamoTRIgine (LAMICTAL) tablet 100 mg  100 mg Oral Daily Himabindu Ravi, MD   100 mg  at 11/27/12 0840  . nicotine (NICODERM CQ - dosed in mg/24 hours) patch 14 mg  14 mg Transdermal Q0600 Himabindu Ravi, MD   14 mg at 11/26/12 0636  . prenatal multivitamin tablet 1 tablet  1 tablet Oral Daily Himabindu Ravi, MD   1 tablet at 11/27/12 0840  . traZODone (DESYREL) tablet 25 mg  25 mg Oral QHS Sanjuana Kava, NP   25 mg at 11/26/12 2142  . valACYclovir (VALTREX) tablet 500 mg  500 mg Oral BID Himabindu Ravi, MD   500 mg at 11/27/12 0840    Lab Results: No results found for this or any previous visit (from the past 48 hour(s)).  Physical Findings: AIMS: Facial and Oral Movements Muscles of Facial Expression: None, normal Lips and Perioral Area: None, normal Jaw: None, normal Tongue: None,  normal,Extremity Movements Upper (arms, wrists, hands, fingers): None, normal Lower (legs, knees, ankles, toes): None, normal, Trunk Movements Neck, shoulders, hips: None, normal, Overall Severity Severity of abnormal movements (highest score from questions above): None, normal Incapacitation due to abnormal movements: None, normal Patient's awareness of abnormal movements (rate only patient's report): No Awareness, Dental Status Current problems with teeth and/or dentures?: No Does patient usually wear dentures?: No  CIWA:    COWS:     Treatment Plan Summary: Daily contact with patient to assess and evaluate symptoms and progress in treatment Medication management  Plan: Decrease Trazodone from 50 mg to 25 mg Q bedtime. Start Neurontin 100 mg Q bedtime for anxiety and sleep.  Will continue current treatment plan.  Medical Decision Making Problem Points:  Established problem, stable/improving (1), Review of last therapy session (1) and Review of psycho-social stressors (1) Data Points:  Review and summation of old records (2) Review of medication regiment & side effects (2) Review of new medications or change in dosage (2)  I certify that inpatient services furnished can reasonably be expected to improve the patient's condition.   Maxmillian Carsey FNP-BC 11/27/2012, 11:38 AM

## 2012-11-27 NOTE — Progress Notes (Signed)
Psychoeducational Group Note  Date:  11/27/2012 Time:  2000  Group Topic/Focus:  Goals Group:   The focus of this group is to help patients establish daily goals to achieve during treatment and discuss how the patient can incorporate goal setting into their daily lives to aide in recovery.  Participation Level:  Active  Participation Quality:  Appropriate  Affect:  Appropriate  Cognitive:  Appropriate  Insight:  Developing/Improving  Engagement in Group:  Developing/Improving  Additional Comments:  Patient stated that she had a good day. Patient was upset with social worker about discharge date, but shared that she was able to regulate her emotions. Patient slept better than the night before, but did happen to wake up in the middle of the night to ask the time. Patient also shared that she felt better when she woke up. Patient hopes to learn resources and use this time to get her "head straight."  Carolyn Rasmussen 11/27/2012, 9:35 PM

## 2012-11-27 NOTE — Progress Notes (Signed)
Psychoeducational Group Note  Date:  11/27/2012 Time:  1100  Group Topic/Focus:  Early Warning Signs:   The focus of this group is to help patients identify signs or symptoms they exhibit before slipping into an unhealthy state or crisis.  Participation Level:  Active  Participation Quality:  Appropriate  Affect:  Appropriate  Cognitive:  Oriented  Insight:  Engaged  Engagement in Group:  Engaged  Additional Comments:  Pt participated well in group this morning.  Marlicia Sroka E 11/27/2012, 1:46 PM

## 2012-11-28 NOTE — Progress Notes (Signed)
[facility] Group Notes:  (Counselor/Nursing/MHT/Case Management/Adjunct)  11/28/2012 9:56 PM  Type of Therapy:  Psychoeducational Skills  Participation Level:  Active  Participation Quality:  Appropriate  Affect:  Appropriate  Cognitive:  Appropriate  Insight:  Engaged  Engagement in Group:  Engaged  Engagement in Therapy:  Engaged  Modes of Intervention:  Education  Summary of Progress/Problems: The patient stated that she had a good day since she was able to go outside for fresh air. In addition, she had a good visit with her father. Her goal for tomorrow is to finish reading her book.    Hazle Coca S 11/28/2012, 9:56 PM

## 2012-11-28 NOTE — Progress Notes (Addendum)
Pt appears in good spirits this am. She states her depression and hopelessness is a 1/1. Pt is in the dayroom and appears to socialize with all the other pts.. She would like to be honest with her support system. Pt states she feels ready to go home and did sign a 72 hour yesterday. Pt does contract for safety and denies SI or HI-Pt has been attending groups and seems very upbeat with good eye contact. Pt is due for a flu shot today. Pt given a flu shot and tolerated well.

## 2012-11-28 NOTE — Progress Notes (Signed)
Goals Group This is a group that identifies the program and helps them to start working on their packets for the day.  Each person sets a goal for the day that is measurable Pt did not attend 

## 2012-11-28 NOTE — Progress Notes (Signed)
Psychoeducational Group Note  Date: 11/28/2012 Time:  1015  Group Topic/Focus:  Identifying Needs:   The focus of this group is to help patients identify their personal needs that have been historically problematic and identify healthy behaviors to address their needs.  Participation Level:  Active  Participation Quality:  Appropriate  Affect:  Appropriate  Cognitive:  Appropriate and Disorganized  Insight:  Engaged  Engagement in Group:  Engaged  Additional Comments:    [name]

## 2012-11-28 NOTE — Progress Notes (Signed)
[facility] MD Progress Note  11/28/2012 2:19 PM NIKIA LEVELS  MRN:  [id number]  Subjective: Neurontin has really helped- still in a very good mood. Asks if she can be discharged today. Advised it would probably be Monday. She and her children live with her parents and she is anxious about Christmas.  Diagnosis:   Axis I: Depression, major, recurrent, mild Axis II: Deferred Axis III:  Past Medical History  Diagnosis Date  . Depression   . Allergy   . GERD (gastroesophageal reflux disease)   . Chronic kidney disease 2002    kidney failure secondary to dehy./ibuprof  . Anxiety   . Genital herpes    Axis IV: other psychosocial or environmental problems Axis V: 41-50 serious symptoms  ADL's:  Intact  Sleep: Poor" Slept better Friday night.  Appetite:  Good  Suicidal Ideation:  Denies  Homicidal Ideation:  Denies  AEB (as evidenced by): Per patient reports.  Psychiatric Specialty Exam: Review of Systems  Psychiatric/Behavioral: Positive for depression (Currently being stabilized with medication.) and substance abuse (Hx of). Negative for suicidal ideas, hallucinations and memory loss. The patient is not nervous/anxious.   All other systems reviewed and are negative.    Blood pressure 124/87, pulse 86, temperature 98 F (36.7 C), temperature source Oral, resp. rate 17, height 5\' 5"  (1.651 m), weight 68.04 kg (150 lb).Body mass index is 24.96 kg/(m^2).  General Appearance: Casual  Eye Contact::  Good  Speech:  Clear and Coherent  Volume:  Normal  Mood:  "I'm feeling better"  Affect:  Appropriate  Thought Process:  Coherent, Goal Directed and Intact  Orientation:  Full (Time, Place, and Person)  Thought Content:  Rumination  Suicidal Thoughts:  No  Homicidal Thoughts:  No  Memory:  Immediate;   Good Recent;   Good Remote;   Good  Judgement:  Good  Insight:  Good  Psychomotor Activity:  Normal  Concentration:  Good  Recall:  Good  Akathisia:  No  Handed:  Right   AIMS (if indicated):     Assets:  Desire for Improvement  Sleep:  Number of Hours: 6.25    Current Medications: Current Facility-Administered Medications  Medication Dose Route Frequency Provider Last Rate Last Dose  . clonazePAM (KLONOPIN) tablet 0.5 mg  0.5 mg Oral TID PRN Himabindu Ravi, MD   0.5 mg at 11/25/12 1212  . DULoxetine (CYMBALTA) DR capsule 40 mg  40 mg Oral BID Himabindu Ravi, MD   40 mg at 11/28/12 0854  . esomeprazole (NEXIUM) capsule 40 mg  40 mg Oral QAC breakfast Himabindu Ravi, MD   40 mg at 11/28/12 4098  . gabapentin (NEURONTIN) capsule 100 mg  100 mg Oral QHS Sanjuana Kava, NP   100 mg at 11/27/12 2154  . lamoTRIgine (LAMICTAL) tablet 100 mg  100 mg Oral Daily Himabindu Ravi, MD   100 mg at 11/28/12 0854  . prenatal multivitamin tablet 1 tablet  1 tablet Oral Daily Himabindu Ravi, MD   1 tablet at 11/28/12 0854  . traZODone (DESYREL) tablet 25 mg  25 mg Oral QHS Sanjuana Kava, NP   25 mg at 11/27/12 2154  . valACYclovir (VALTREX) tablet 500 mg  500 mg Oral BID Himabindu Ravi, MD   500 mg at 11/28/12 1191    Lab Results: No results found for this or any previous visit (from the past 48 hour(s)).  Physical Findings: AIMS: Facial and Oral Movements Muscles of Facial Expression: None, normal Lips and Perioral  Area: None, normal Jaw: None, normal Tongue: None, normal,Extremity Movements Upper (arms, wrists, hands, fingers): None, normal Lower (legs, knees, ankles, toes): None, normal, Trunk Movements Neck, shoulders, hips: None, normal, Overall Severity Severity of abnormal movements (highest score from questions above): None, normal Incapacitation due to abnormal movements: None, normal Patient's awareness of abnormal movements (rate only patient's report): No Awareness, Dental Status Current problems with teeth and/or dentures?: No Does patient usually wear dentures?: No  CIWA:    COWS:     Treatment Plan Summary: Daily contact with patient to assess and  evaluate symptoms and progress in treatment Medication management  Plan: Decrease Trazodone from 50 mg to 25 mg Q bedtime. Start Neurontin 100 mg Q bedtime for anxiety and sleep.  Will continue current treatment plan.Plans to go to [facility] [place] after Discharge.  Medical Decision Making Problem Points:  Established problem, stable/improving (1), Review of last therapy session (1) and Review of psycho-social stressors (1) Data Points:  Review and summation of old records (2) Review of medication regiment & side effects (2) Review of new medications or change in dosage (2)  I certify that inpatient services furnished can reasonably be expected to improve the patient's condition.   Dwan Hemmelgarn,MICKIE D. PA-C and CAQ Psych  11/28/2012, 2:19 PM

## 2012-11-28 NOTE — Clinical Social Work Note (Signed)
[facility] Group Notes:  (Clinical Social Work)  11/28/2012   3:00-4:00PM  Summary of Progress/Problems:   The main focus of today's process group was for the patient to identify ways in which they have in the past sabotaged their own recovery and reasons they may have done this/what they received from doing it.  We then worked to identify a specific plan to avoid doing this when discharged from the hospital for this admission.  The patient expressed that her self-sabotaging thought is "I can handle just one beer," which she identified as a lie.  She couple that with a truth, and agreed to practice saying this to herself in an attempt at cognitive restructuring.  Type of Therapy:  Group Therapy - Process  Participation Level:  Active  Participation Quality:  Appropriate, Attentive, Sharing and Supportive  Affect:  Appropriate  Cognitive:  Alert, Appropriate and Oriented  Insight:  Engaged  Engagement in Therapy:  Engaged  Modes of Intervention:  Clarification, Education, Limit-setting, Problem-solving, Socialization, Support and Processing, Exploration, Discussion   Ambrose Mantle, LCSW 11/28/2012, 4:06 PM

## 2012-11-29 NOTE — Progress Notes (Signed)
The patient attended the A. A. Meeting on the 300 hallway. 

## 2012-11-29 NOTE — Progress Notes (Signed)
D Haruna is doing well...taking her medications scheduled. She attends her groups, is engaged in her recovery and is actively trying to understand and  Learn healtheir ways to get her needs met.  A She completed her self inventory today and on it she wrote  She denied SI with in the past 24 hrs, she rated her depression and hopelessness "1/1" and she stated her DC plan is to: " listen, talk, stay sober, go to therapy, group therapy and support groups".  R Safety is in place and POC cont.

## 2012-11-29 NOTE — Progress Notes (Signed)
Reviewed and agree with plan.

## 2012-11-29 NOTE — Progress Notes (Signed)
Writer has observed patient in the dayroom interacting with select peers, playing cards. Patient attended group and reports that her goal is to finish reading her book that she started on yesterday. Patient reported to Clinical research associate that she is sleeping much better and is glad that her medications are working. Patient offered support and encouragement, safety maintained on unit, will continue to monitor. Patient denies si/hi/a/v hallucinations.

## 2012-11-29 NOTE — Progress Notes (Signed)
[facility] MD Progress Note  11/29/2012 1:05 PM Carolyn Rasmussen  MRN:  [id number]  Subjective: Neurontin has really helped- still in a very good mood. Asks if she can be discharged today. Advised it would probably be Monday. She and her children live with her parents and she is anxious about Christmas.  Diagnosis:   Axis I: Depression, major, recurrent, mild Axis II: Deferred Axis III:  Past Medical History  Diagnosis Date  . Depression   . Allergy   . GERD (gastroesophageal reflux disease)   . Chronic kidney disease 2002    kidney failure secondary to dehy./ibuprof  . Anxiety   . Genital herpes    Axis IV: other psychosocial or environmental problems Axis V: 41-50 serious symptoms  ADL's:  Intact  Sleep: Poor" Slept better Friday night.  Appetite:  Good  Suicidal Ideation:  Denies  Homicidal Ideation:  Denies  AEB (as evidenced by): Per patient reports.  Psychiatric Specialty Exam: Review of Systems  Psychiatric/Behavioral: Positive for depression (Currently being stabilized with medication.) and substance abuse (Hx of). Negative for suicidal ideas, hallucinations and memory loss. The patient is not nervous/anxious.   All other systems reviewed and are negative.    Blood pressure 124/87, pulse 86, temperature 98 F (36.7 C), temperature source Oral, resp. rate 17, height 5\' 5"  (1.651 m), weight 68.04 kg (150 lb).Body mass index is 24.96 kg/(m^2).  General Appearance: Casual  Eye Contact::  Good  Speech:  Clear and Coherent  Volume:  Normal  Mood:  "I'm feeling better"  Affect:  Appropriate  Thought Process:  Coherent, Goal Directed and Intact  Orientation:  Full (Time, Place, and Person)  Thought Content:  Rumination  Suicidal Thoughts:  No  Homicidal Thoughts:  No  Memory:  Immediate;   Good Recent;   Good Remote;   Good  Judgement:  Good  Insight:  Good  Psychomotor Activity:  Normal  Concentration:  Good  Recall:  Good  Akathisia:  No  Handed:  Right   AIMS (if indicated):     Assets:  Desire for Improvement  Sleep:  Number of Hours: 5.75    Current Medications: Current Facility-Administered Medications  Medication Dose Route Frequency Provider Last Rate Last Dose  . clonazePAM (KLONOPIN) tablet 0.5 mg  0.5 mg Oral TID PRN Himabindu Ravi, MD   0.5 mg at 11/25/12 1212  . DULoxetine (CYMBALTA) DR capsule 40 mg  40 mg Oral BID Himabindu Ravi, MD   40 mg at 11/29/12 0820  . esomeprazole (NEXIUM) capsule 40 mg  40 mg Oral QAC breakfast Himabindu Ravi, MD   40 mg at 11/29/12 0643  . gabapentin (NEURONTIN) capsule 100 mg  100 mg Oral QHS Sanjuana Kava, NP   100 mg at 11/28/12 2154  . lamoTRIgine (LAMICTAL) tablet 100 mg  100 mg Oral Daily Himabindu Ravi, MD   100 mg at 11/29/12 0820  . prenatal multivitamin tablet 1 tablet  1 tablet Oral Daily Himabindu Ravi, MD   1 tablet at 11/29/12 0820  . traZODone (DESYREL) tablet 25 mg  25 mg Oral QHS Sanjuana Kava, NP   25 mg at 11/28/12 2154  . valACYclovir (VALTREX) tablet 500 mg  500 mg Oral BID Himabindu Ravi, MD   500 mg at 11/29/12 0820    Lab Results: No results found for this or any previous visit (from the past 48 hour(s)).  Physical Findings: AIMS: Facial and Oral Movements Muscles of Facial Expression: None, normal Lips and Perioral  Area: None, normal Jaw: None, normal Tongue: None, normal,Extremity Movements Upper (arms, wrists, hands, fingers): None, normal Lower (legs, knees, ankles, toes): None, normal, Trunk Movements Neck, shoulders, hips: None, normal, Overall Severity Severity of abnormal movements (highest score from questions above): None, normal Incapacitation due to abnormal movements: None, normal Patient's awareness of abnormal movements (rate only patient's report): No Awareness, Dental Status Current problems with teeth and/or dentures?: No Does patient usually wear dentures?: No  CIWA:    COWS:     Treatment Plan Summary: Daily contact with patient to assess and  evaluate symptoms and progress in treatment Medication management  Plan: Decrease Trazodone from 50 mg to 25 mg Q bedtime. Start Neurontin 100 mg Q bedtime for anxiety and sleep.  Will continue current treatment plan.Plans to go to [facility] [place] after Discharge.  Medical Decision Making Problem Points:  Established problem, stable/improving (1), Review of last therapy session (1) and Review of psycho-social stressors (1) Data Points:  Review and summation of old records (2) Review of medication regiment & side effects (2) Review of new medications or change in dosage (2)  I certify that inpatient services furnished can reasonably be expected to improve the patient's condition.   Sheletha Bow,MICKIE D. PA-C and CAQ Psych  11/29/2012, 1:05 PM

## 2012-11-29 NOTE — Progress Notes (Signed)
Psychoeducational Group Note  Date:  11/29/2012 Time:  0115  Group Topic/Focus:  Identifying Needs:   The focus of this group is to help patients identify their personal needs that have been historically problematic and identify healthy behaviors to address their needs.  Participation Level:  Active  Participation Quality:  Supportive  Affect:  Appropriate  Cognitive:  Appropriate  Insight:  Engaged  Engagement in Group:  Engaged  Additional Comments:  Patient did not share during group time but patient was very supportive of other patients on the hall.  Matasha Smigelski, Newton Pigg 11/29/2012, 2:27 PM

## 2012-11-29 NOTE — Clinical Social Work Note (Signed)
[facility] Group Notes: (Clinical Social Work)   11/29/2012      Type of Therapy:  Group Therapy   Participation Level:  Did Not Attend    Ambrose Mantle, LCSW 11/29/2012, 4:36 PM

## 2012-11-29 NOTE — Progress Notes (Signed)
Reviewed the note and agree with the plan of treatment...Yvonnie Schinke, MD 

## 2012-11-29 NOTE — Progress Notes (Signed)
[facility] MD Progress Note  11/29/2012 12:56 PM Carolyn Rasmussen  MRN:  [id number]  Subjective:Slept really well, woke up refreshed. Feels she could be discharged tomorrow. Says that she has signed a 72 hour request for discharge to make sure she can go out and shop for her Children's Christmas. She said when she cam she did need to be here but now needs to apply what she has learned. Diagnosis:   Axis I: Depression, major, recurrent, mild Axis II: Deferred Axis III:  Past Medical History  Diagnosis Date  . Depression   . Allergy   . GERD (gastroesophageal reflux disease)   . Chronic kidney disease 2002    kidney failure secondary to dehy./ibuprof  . Anxiety   . Genital herpes    Axis IV: other psychosocial or environmental problems Axis V: 41-50 serious symptoms  ADL's:  Intact  Sleep: Poor" Slept better Friday night.  Appetite:  Good  Suicidal Ideation:  Denies  Homicidal Ideation:  Denies  AEB (as evidenced by): Per patient reports.  Psychiatric Specialty Exam: Review of Systems  Psychiatric/Behavioral: Positive for depression (Currently being stabilized with medication.) and substance abuse (Hx of). Negative for suicidal ideas, hallucinations and memory loss. The patient is not nervous/anxious.   All other systems reviewed and are negative.    Blood pressure 124/87, pulse 86, temperature 98 F (36.7 C), temperature source Oral, resp. rate 17, height 5\' 5"  (1.651 m), weight 68.04 kg (150 lb).Body mass index is 24.96 kg/(m^2).  General Appearance: Casual  Eye Contact::  Good  Speech:  Clear and Coherent  Volume:  Normal  Mood:  "I'm feeling better"  Affect:  Appropriate  Thought Process:  Coherent, Goal Directed and Intact  Orientation:  Full (Time, Place, and Person)  Thought Content:  Rumination  Suicidal Thoughts:  No  Homicidal Thoughts:  No  Memory:  Immediate;   Good Recent;   Good Remote;   Good  Judgement:  Good  Insight:  Good  Psychomotor  Activity:  Normal  Concentration:  Good  Recall:  Good  Akathisia:  No  Handed:  Right  AIMS (if indicated):     Assets:  Desire for Improvement  Sleep:  Number of Hours: 5.75    Current Medications: Current Facility-Administered Medications  Medication Dose Route Frequency Provider Last Rate Last Dose  . clonazePAM (KLONOPIN) tablet 0.5 mg  0.5 mg Oral TID PRN Himabindu Ravi, MD   0.5 mg at 11/25/12 1212  . DULoxetine (CYMBALTA) DR capsule 40 mg  40 mg Oral BID Himabindu Ravi, MD   40 mg at 11/29/12 0820  . esomeprazole (NEXIUM) capsule 40 mg  40 mg Oral QAC breakfast Himabindu Ravi, MD   40 mg at 11/29/12 0643  . gabapentin (NEURONTIN) capsule 100 mg  100 mg Oral QHS Sanjuana Kava, NP   100 mg at 11/28/12 2154  . lamoTRIgine (LAMICTAL) tablet 100 mg  100 mg Oral Daily Himabindu Ravi, MD   100 mg at 11/29/12 0820  . prenatal multivitamin tablet 1 tablet  1 tablet Oral Daily Himabindu Ravi, MD   1 tablet at 11/29/12 0820  . traZODone (DESYREL) tablet 25 mg  25 mg Oral QHS Sanjuana Kava, NP   25 mg at 11/28/12 2154  . valACYclovir (VALTREX) tablet 500 mg  500 mg Oral BID Himabindu Ravi, MD   500 mg at 11/29/12 0820    Lab Results: No results found for this or any previous visit (from the past 48 hour(s)).  Physical Findings: AIMS: Facial and Oral Movements Muscles of Facial Expression: None, normal Lips and Perioral Area: None, normal Jaw: None, normal Tongue: None, normal,Extremity Movements Upper (arms, wrists, hands, fingers): None, normal Lower (legs, knees, ankles, toes): None, normal, Trunk Movements Neck, shoulders, hips: None, normal, Overall Severity Severity of abnormal movements (highest score from questions above): None, normal Incapacitation due to abnormal movements: None, normal Patient's awareness of abnormal movements (rate only patient's report): No Awareness, Dental Status Current problems with teeth and/or dentures?: No Does patient usually wear dentures?: No   CIWA:    COWS:     Treatment Plan Summary: Daily contact with patient to assess and evaluate symptoms and progress in treatment Medication management  Plan: Decrease Trazodone from 50 mg to 25 mg Q bedtime. Start Neurontin 100 mg Q bedtime for anxiety and sleep.  Will continue current treatment plan.Plans to go to [facility] after Discharge.  Medical Decision Making Problem Points:  Established problem, stable/improving (1), Review of last therapy session (1) and Review of psycho-social stressors (1) Data Points:  Review and summation of old records (2) Review of medication regiment & side effects (2) Review of new medications or change in dosage (2)  I certify that inpatient services furnished can reasonably be expected to improve the patient's condition.   Carolyn Rasmussen,Carolyn D. PA-C and CAQ Psych  11/29/2012, 12:56 PM

## 2012-11-29 NOTE — Progress Notes (Signed)
Patient has been up and active on the unit, interacting appropriately with peers. Patient attended AA group this evening and reported to Clinical research associate that she enjoyed group. Patient reports that she is being discharged on tomorrow and has her appt. set up and will be with her mother and her kids during the holiday. Patient denies si/hi/a/v hallucinations. Support and encouragement offered, safety maintained on unit, will continue to monitor.

## 2012-11-29 NOTE — Progress Notes (Signed)
Psychoeducational Group Note  Date:  11/29/2012 Time:  1015  Group Topic/Focus:  Making Healthy Choices:   The focus of this group is to help patients identify negative/unhealthy choices they were using prior to admission and identify positive/healthier coping strategies to replace them upon discharge.  Participation Level:  Active  Participation Quality:  Appropriate  Affect:  Appropriate  Cognitive:  Alert  Insight:  Engaged  Engagement in Group:  Engaged  Additional Comments:    [name] 11/29/2012

## 2012-11-30 MED ORDER — DULOXETINE HCL 20 MG PO CPEP: 40.0000 mg | ORAL_CAPSULE | Freq: Two times a day (BID) | ORAL | Status: DC

## 2012-11-30 MED ORDER — GABAPENTIN 100 MG PO CAPS: 100.0000 mg | ORAL_CAPSULE | Freq: Every day | ORAL | Status: DC

## 2012-11-30 MED ORDER — TRAZODONE 25 MG HALF TABLET: 25.0000 mg | ORAL_TABLET | Freq: Every day | ORAL | Status: DC

## 2012-11-30 MED ORDER — VALACYCLOVIR HCL 500 MG PO TABS: 500.0000 mg | ORAL_TABLET | Freq: Two times a day (BID) | ORAL | Status: DC

## 2012-11-30 MED ORDER — LAMOTRIGINE 100 MG PO TABS: 100.0000 mg | ORAL_TABLET | Freq: Every day | ORAL | Status: DC

## 2012-11-30 MED ORDER — ESOMEPRAZOLE MAGNESIUM 40 MG PO CPDR: 40.0000 mg | DELAYED_RELEASE_CAPSULE | Freq: Every day | ORAL | Status: DC

## 2012-11-30 MED ORDER — PRENATAL MULTIVITAMIN CH: 1.0000 | ORAL_TABLET | Freq: Every day | ORAL | Status: DC

## 2012-11-30 NOTE — [facility] Suicide Risk Assessment (Signed)
Suicide Risk Assessment  Discharge Assessment     Demographic Factors:  Female, caucasian, single Mental Status Per Nursing Assessment::   On Admission:  Suicidal ideation indicated by patient;Self-harm thoughts  Current Mental Status by Physician: Patient alert and oriented to 4. Denies AH/VH/SI/HI.  Loss Factors: Decrease in vocational status and Loss of significant relationship  Historical Factors: Impulsivity  Risk Reduction Factors:   Responsible for children under 52 years of age and Positive coping skills or problem solving skills  Continued Clinical Symptoms:  Depression:   Recent sense of peace/wellbeing Alcohol/Substance Abuse/Dependencies  Cognitive Features That Contribute To Risk:  Cognitively intact  Suicide Risk:  Minimal: No identifiable suicidal ideation.  Patients presenting with no risk factors but with morbid ruminations; may be classified as minimal risk based on the severity of the depressive symptoms  Discharge Diagnoses:   AXIS I:  Depressive Disorder NOS, Polysubstance Abuse AXIS II:  No diagnosis AXIS III:   Past Medical History  Diagnosis Date  . Depression   . Allergy   . GERD (gastroesophageal reflux disease)   . Chronic kidney disease 2002    kidney failure secondary to dehy./ibuprof  . Anxiety   . Genital herpes    AXIS IV:  economic problems and occupational problems AXIS V:  61-70 mild symptoms  Plan Of Care/Follow-up recommendations:  Activity:  normal Diet:  normal  Is patient on multiple antipsychotic therapies at discharge:  No   Has Patient had three or more failed trials of antipsychotic monotherapy by history:  No  Recommended Plan for Multiple Antipsychotic Therapies: NA  Carolyn Rasmussen 11/30/2012, 9:43 AM

## 2012-11-30 NOTE — Discharge Summary (Signed)
Physician Discharge Summary Note  Patient:  Carolyn Rasmussen is an 31 y.o., female MRN:  [id number] DOB:  1981-11-12 Patient phone:  [phone] (home)  Patient address:   [address] [place] [address],   Date of Admission:  11/24/2012 Date of Discharge: 11/30/2012  Reason for Admission:  Depression with suicidal ideations  Discharge Diagnoses: Principal Problem:  *Depression, major, recurrent, mild  Review of Systems  Constitutional: Negative.   HENT: Negative.   Eyes: Negative.   Respiratory: Negative.   Cardiovascular: Negative.   Gastrointestinal: Negative.   Genitourinary: Negative.   Musculoskeletal: Negative.   Skin: Negative.   Neurological: Negative.   Endo/Heme/Allergies: Negative.   Psychiatric/Behavioral: Negative.    Axis Diagnosis:   AXIS I:  Major Depression, Recurrent severe AXIS II:  Deferred AXIS III:   Past Medical History  Diagnosis Date  . Depression   . Allergy   . GERD (gastroesophageal reflux disease)   . Chronic kidney disease 2002    kidney failure secondary to dehy./ibuprof  . Anxiety   . Genital herpes    AXIS IV:  economic problems, occupational problems, other psychosocial or environmental problems, problems related to social environment and problems with primary support group AXIS V:  61-70 mild symptoms  Level of Care:  OP  Hospital Course:  Individual and group therapy, started neurotonin for anxiety and sleep--trazodone reduced due to feeling lethargic in the mornings, decreased Cymbalta and lamictal for depression management (increased prior to admission and made her suicidal).  Carolyn Rasmussen states she will stop hanging out with her drinking her friends and associate with ones who do not.  She also plans to spend more time at home and exercise for her depression and anxiety.  She will obtain a sponsor to help maintain her sobriety and go to support groups.  She will return to live with her parents and children--supportive  environment.  Carolyn Rasmussen has good insight and a positive outlook for the future; stable for dishcharge--denies depression, suicidal/homicidal ideations and hallucinations.  Consults:  None  Significant Diagnostic Studies:  labs: Completed and reviewed,stable  Discharge Vitals:   Blood pressure 125/84, pulse 99, temperature 98.4 F (36.9 C), temperature source Oral, resp. rate 16, height 5\' 5"  (1.651 m), weight 68.04 kg (150 lb). Body mass index is 24.96 kg/(m^2). Lab Results:   No results found for this or any previous visit (from the past 72 hour(s)).  Physical Findings: AIMS: Facial and Oral Movements Muscles of Facial Expression: None, normal Lips and Perioral Area: None, normal Jaw: None, normal Tongue: None, normal,Extremity Movements Upper (arms, wrists, hands, fingers): None, normal Lower (legs, knees, ankles, toes): None, normal, Trunk Movements Neck, shoulders, hips: None, normal, Overall Severity Severity of abnormal movements (highest score from questions above): None, normal Incapacitation due to abnormal movements: None, normal Patient's awareness of abnormal movements (rate only patient's report): No Awareness, Dental Status Current problems with teeth and/or dentures?: No Does patient usually wear dentures?: No  CIWA:    COWS:     Psychiatric Specialty Exam: See Psychiatric Specialty Exam and Suicide Risk Assessment completed by Attending Physician prior to discharge.  Discharge destination:  Home  Is patient on multiple antipsychotic therapies at discharge:  No   Has Patient had three or more failed trials of antipsychotic monotherapy by history:  No Recommended Plan for Multiple Antipsychotic Therapies:  N/A   Discharge Orders    Future Orders Please Complete By Expires   Diet - low sodium heart healthy  Activity as tolerated - No restrictions          Medication List     As of 11/30/2012 11:36 AM    STOP taking these medications          clonazePAM 0.5 MG tablet   Commonly known as: KLONOPIN      esomeprazole 40 MG packet   Commonly known as: NEXIUM   Replaced by: esomeprazole 40 MG capsule      guaiFENesin 600 MG 12 hr tablet   Commonly known as: MUCINEX      TAKE these medications      Indication    DULoxetine 20 MG capsule   Commonly known as: CYMBALTA   Take 2 capsules (40 mg total) by mouth 2 (two) times daily.    Indication: Major Depressive Disorder      esomeprazole 40 MG capsule   Commonly known as: NEXIUM   Take 1 capsule (40 mg total) by mouth daily before breakfast.    Indication: Gastroesophageal Reflux Disease with Current Symptoms      gabapentin 100 MG capsule   Commonly known as: NEURONTIN   Take 1 capsule (100 mg total) by mouth at bedtime.    Indication: Neurogenic Pain      lamoTRIgine 100 MG tablet   Commonly known as: LAMICTAL   Take 1 tablet (100 mg total) by mouth daily.    Indication: Mood stabilization      prenatal multivitamin Tabs   Take 1 tablet by mouth daily.    Indication: vitamin supplement      traZODone 25 mg Tabs   Commonly known as: DESYREL   Take 0.5 tablets (25 mg total) by mouth at bedtime.    Indication: Trouble Sleeping      valACYclovir 500 MG tablet   Commonly known as: VALTREX   Take 1 tablet (500 mg total) by mouth 2 (two) times daily.    Indication: Prevent recurrent herpes           Follow-up Information    [facility] with [facility] On 12/04/2012. (Please go to [facility] walk in clinic on Thursday, December 04, 2012 or any weekday between 8AM-12 Noon for medication management and counseling)    Contact information:   [address]  [place], [place]  [address]   [phone]  HP Address (if you want services there) [facility], [address] [place], [place] [address] [phone]         Follow-up recommendations:  Activity as tolerated, low-sodium heart healthy diet  Comments:  Follow-up with AA support group and will  obtain a sponsor, [facility]  Total Discharge Time:  Greater than 30 minutes  Signed: Nanine Means, PMH-NP 11/30/2012, 11:36 AM

## 2012-11-30 NOTE — Progress Notes (Signed)
Patient ID: Carolyn Rasmussen, female   DOB: Nov 30, 1981, 31 y.o.   MRN: [id number] She has been discharged home and was picked up by a friend. Pt voiced understanding of discharge instruction and of follow up plan. She denies thoughts of SI and all her belonging taken home with her.

## 2012-11-30 NOTE — Progress Notes (Signed)
[facility] Adult Case Management Discharge Plan :  Will you be returning to the same living situation after discharge: Yes,  returning home At discharge, do you have transportation home?:Yes,  access to transportation Do you have the ability to pay for your medications:Yes,  access to meds  Release of information consent forms completed and in the chart;  Patient's signature needed at discharge.  Patient to Follow up at: Follow-up Information    Follow up with [facility]. On 12/04/2012. (Please go to [facility] walk in clinic on Thursday, December 04, 2012 or any weekday between 8AM-12 Noon for medication management and counseling)    Contact information:   [address]  [place], [place]  [address]   [phone]  HP Address (if you want services there) [facility], [address] [place], [place] [address] [phone]         Patient denies SI/HI:   Yes,  denies SI/HI    Safety Planning and Suicide Prevention discussed:  Yes,  discussed with pt  No recommendations from CSW.  No further needs voiced by pt.  Pt stable to discharge.    Carolyn Rasmussen 11/30/2012, 10:31 AM

## 2012-11-30 NOTE — Progress Notes (Signed)
[facility] LCSW Aftercare Discharge Planning Group Note  11/30/2012 9:30 AM  Participation Quality:  Appropriate, Attentive, Sharing and Supportive  Affect:  Excited and Bright  Cognitive:  Alert, Appropriate and Oriented  Insight:  Developing/Improving  Engagement in Group:  Engaged  Modes of Intervention:  Discussion, Exploration and Problem-solving  Summary of Progress/Problems:  Patient attending morning group and received daily packet. Patient reports she is feeling very well, meds are working well and she is sleeping good. Denies and reports depression and anxiety are at a zero. Patient reports she is going home today and has transportation that can be here whenever. Reports she will attend [facility]. Would like A.A. Meeting list as well to follow up on.  No barriers known at this time, no other needs identified.  Nail, Catalina Gravel 11/30/2012, 9:30 AM

## 2012-12-01 NOTE — Progress Notes (Signed)
Patient Discharge Instructions:  After Visit Summary (AVS):   Faxed to:  12/01/12 Psychiatric Admission Assessment Note:   Faxed to:  12/01/12 Suicide Risk Assessment - Discharge Assessment:   Faxed to:  12/01/12 Faxed/Sent to the Next Level Care provider:  12/01/12 Faxed to [facility] @ [phone]  Jerelene Redden, 12/01/2012, 3:55 PM

## 2012-12-03 NOTE — Discharge Summary (Signed)
Reviewed

## 2013-04-01 ENCOUNTER — Other Ambulatory Visit: Payer: Self-pay | Admitting: Family Medicine

## 2013-04-02 NOTE — Telephone Encounter (Signed)
Last refill:12-26-11 Last OV:10-15-12 Pleasee advise.//AB/CMA

## 2013-06-17 ENCOUNTER — Emergency Department (HOSPITAL_BASED_OUTPATIENT_CLINIC_OR_DEPARTMENT_OTHER): Payer: Self-pay

## 2013-06-17 ENCOUNTER — Emergency Department (HOSPITAL_BASED_OUTPATIENT_CLINIC_OR_DEPARTMENT_OTHER)
Admission: EM | Admit: 2013-06-17 | Discharge: 2013-06-17 | Disposition: A | Discharge status: Home / Self Care | Payer: Self-pay | Attending: Emergency Medicine | Admitting: Emergency Medicine

## 2013-06-17 ENCOUNTER — Other Ambulatory Visit: Payer: Self-pay

## 2013-06-17 ENCOUNTER — Encounter (HOSPITAL_BASED_OUTPATIENT_CLINIC_OR_DEPARTMENT_OTHER): Payer: Self-pay | Admitting: *Deleted

## 2013-06-17 DIAGNOSIS — R059 Cough, unspecified: Secondary | ICD-10-CM | POA: Insufficient documentation

## 2013-06-17 DIAGNOSIS — K219 Gastro-esophageal reflux disease without esophagitis: Secondary | ICD-10-CM | POA: Insufficient documentation

## 2013-06-17 DIAGNOSIS — R05 Cough: Secondary | ICD-10-CM | POA: Insufficient documentation

## 2013-06-17 DIAGNOSIS — Z8619 Personal history of other infectious and parasitic diseases: Secondary | ICD-10-CM | POA: Insufficient documentation

## 2013-06-17 DIAGNOSIS — N189 Chronic kidney disease, unspecified: Secondary | ICD-10-CM | POA: Insufficient documentation

## 2013-06-17 DIAGNOSIS — F411 Generalized anxiety disorder: Secondary | ICD-10-CM | POA: Insufficient documentation

## 2013-06-17 DIAGNOSIS — Z79899 Other long term (current) drug therapy: Secondary | ICD-10-CM | POA: Insufficient documentation

## 2013-06-17 DIAGNOSIS — R079 Chest pain, unspecified: Secondary | ICD-10-CM

## 2013-06-17 DIAGNOSIS — F329 Major depressive disorder, single episode, unspecified: Secondary | ICD-10-CM | POA: Insufficient documentation

## 2013-06-17 DIAGNOSIS — R0602 Shortness of breath: Secondary | ICD-10-CM | POA: Insufficient documentation

## 2013-06-17 DIAGNOSIS — F3289 Other specified depressive episodes: Secondary | ICD-10-CM | POA: Insufficient documentation

## 2013-06-17 DIAGNOSIS — R0789 Other chest pain: Secondary | ICD-10-CM | POA: Insufficient documentation

## 2013-06-17 MED ORDER — CLONAZEPAM 0.5 MG PO TABS: 0.5000 mg | ORAL_TABLET | Freq: Two times a day (BID) | ORAL | Status: DC | PRN

## 2013-06-17 NOTE — ED Notes (Signed)
Pt c/o Cp " off and on" x 1 month also c/o cough

## 2013-06-17 NOTE — ED Provider Notes (Signed)
History    CSN: [id number] Arrival date & time 06/17/13  1755  First MD Initiated Contact with Patient 06/17/13 1853     Chief Complaint  Patient presents with  . Chest Pain    Patient is a 32 y.o. female presenting with chest pain. The history is provided by the patient.  Chest Pain Pain location:  L chest Pain quality comment:  Squeezing Pain radiates to:  L shoulder Pain radiates to the back: no   Pain severity:  Mild Onset quality:  Gradual Duration:  1 month Timing:  Intermittent Progression:  Unchanged Relieved by:  Nothing Worsened by:  Nothing tried Associated symptoms: cough and shortness of breath   Associated symptoms: no abdominal pain, no back pain, no fever, no lower extremity edema, no palpitations, no syncope, not vomiting and no weakness   Pt reports left sided CP on/off for past month It will come at random, not triggered by exertion and not pleuritic At times the pain will radiate into left shoulder No personal h/o CAD No personal h/o DVT/PE No premature family h/o CAD No LE pain/edema No syncope At times she feels it is stress related.  She reports frequent anxiety attacks but no hopelessness and no SI reported She reports cough at times as well Currently her pain is improving Past Medical History  Diagnosis Date  . Depression   . Allergy   . GERD (gastroesophageal reflux disease)   . Chronic kidney disease 2002    kidney failure secondary to dehy./ibuprof  . Anxiety   . Genital herpes    Past Surgical History  Procedure Laterality Date  . Tubes in ears      x4  . Typanoplasty    . Kidney stone surgery     Family History  Problem Relation Age of Onset  . Hypertension Mother   . Hyperlipidemia Father   . Heart disease Father   . Anxiety disorder Maternal Grandfather   . Anxiety disorder Maternal Grandmother   . Anxiety disorder Paternal Grandfather   . Anxiety disorder Paternal Grandmother    History  Substance Use Topics  .  Smoking status: Passive Smoke Exposure - Never Smoker -- 0.30 packs/day  . Smokeless tobacco: Current User     Comment: 3 cigerettes daily for 3 months   . Alcohol Use: 0.0 oz/week     Comment: Pt was binge drinking 2-3 times a week-- Quit in July 2013   OB History   Grav Para Term Preterm Abortions TAB SAB Ect Mult Living                 Review of Systems  Constitutional: Negative for fever.  Respiratory: Positive for cough and shortness of breath.   Cardiovascular: Positive for chest pain. Negative for palpitations, leg swelling and syncope.  Gastrointestinal: Negative for vomiting and abdominal pain.  Musculoskeletal: Negative for back pain.  Neurological: Negative for weakness.  Psychiatric/Behavioral: Negative for suicidal ideas, dysphoric mood and agitation. The patient is nervous/anxious.   All other systems reviewed and are negative.    Allergies  Sulfa antibiotics and Ceftin  Home Medications   Current Outpatient Rx  Name  Route  Sig  Dispense  Refill  . clonazePAM (KLONOPIN) 0.5 MG tablet   Oral   Take 0.5 mg by mouth 2 (two) times daily as needed for anxiety.         . DULoxetine (CYMBALTA) 20 MG capsule   Oral   Take 2 capsules (40 mg total)  by mouth 2 (two) times daily.   120 capsule   0   . esomeprazole (NEXIUM) 40 MG capsule   Oral   Take 1 capsule (40 mg total) by mouth daily before breakfast.   30 capsule   0   . gabapentin (NEURONTIN) 100 MG capsule   Oral   Take 1 capsule (100 mg total) by mouth at bedtime.   30 capsule   0   . lamoTRIgine (LAMICTAL) 100 MG tablet   Oral   Take 1 tablet (100 mg total) by mouth daily.   30 tablet   0   . meclizine (ANTIVERT) 25 MG tablet      TAKE 1 TABLET BY MOUTH 3 TIMES DAILY AS NEEDED.   30 tablet   0   . Prenatal Vit-Fe Fumarate-FA (PRENATAL MULTIVITAMIN) TABS   Oral   Take 1 tablet by mouth daily.   30 tablet   0   . traZODone (DESYREL) 25 mg TABS   Oral   Take 0.5 tablets (25 mg total)  by mouth at bedtime.   30 tablet   0   . valACYclovir (VALTREX) 500 MG tablet   Oral   Take 1 tablet (500 mg total) by mouth 2 (two) times daily.   30 tablet   0    BP 109/79  Pulse 74  Temp(Src) 98.6 F (37 C) (Oral)  Resp 16  Ht 5\' 4"  (1.626 m)  Wt 130 lb (58.968 kg)  BMI 22.3 kg/m2  SpO2 100% Physical Exam CONSTITUTIONAL: Well developed/well nourished HEAD: Normocephalic/atraumatic EYES: EOMI/PERRL ENMT: Mucous membranes moist NECK: supple no meningeal signs SPINE:entire spine nontender CV: S1/S2 noted, no murmurs/rubs/gallops noted LUNGS: Lungs are clear to auscultation bilaterally, no apparent distress ABDOMEN: soft, nontender, no rebound or guarding GU:no cva tenderness NEURO: Pt is awake/alert, moves all extremitiesx4 EXTREMITIES: pulses normal, full ROM, no LE edema, no calf tenderness SKIN: warm, color normal PSYCH: mildly anxious  ED Course  Procedures (including critical care time) Labs Reviewed - No data to display Dg Chest 2 View  06/17/2013   *RADIOLOGY REPORT*  Clinical Data: Chest pain and shortness of breath.  CHEST - 2 VIEW  Comparison:  None.  Findings:  The heart size and mediastinal contours are within normal limits.  Both lungs are clear.  The visualized skeletal structures are unremarkable.  IMPRESSION: No active cardiopulmonary disease.   Original Report Authenticated By: Myles Rosenthal, M.D.   Pt reports all of this pain seems related to stress/anxiety.  Currently she is well appearing and in no distress.  I doubt PE/ACS at this time.  No signs of aortic dissection or pericarditis/myocarditis.   She requests short course of klonopin and reports she will f/u for her anxiety As for her CP, I do think she should f/u with cardiology as outpatient, and she agrees.  She reports she used to have palpitations and had echo several yrs ago that was "normal" She currently denies SI and denies feelings of hopelessness  MDM  xrays reviewed and  considered Nursing notes including past medical history and social history reviewed and considered in documentation    Date: 06/17/2013 1810  Rate: 74  Rhythm: normal sinus rhythm  QRS Axis: normal  Intervals: normal  ST/T Wave abnormalities: nonspecific ST changes  Conduction Disutrbances:none  Narrative Interpretation:   Old EKG Reviewed: none available at time of interpretation     Joya Gaskins, MD 06/18/13 0006

## 2014-05-09 ENCOUNTER — Ambulatory Visit (INDEPENDENT_AMBULATORY_CARE_PROVIDER_SITE_OTHER): Payer: BC Managed Care – PPO | Admitting: Psychology

## 2014-05-09 DIAGNOSIS — F411 Generalized anxiety disorder: Secondary | ICD-10-CM

## 2014-05-23 ENCOUNTER — Ambulatory Visit: Payer: BC Managed Care – PPO | Admitting: Psychology

## 2014-06-01 ENCOUNTER — Ambulatory Visit (INDEPENDENT_AMBULATORY_CARE_PROVIDER_SITE_OTHER): Payer: BC Managed Care – PPO | Admitting: Psychology

## 2014-06-01 DIAGNOSIS — F331 Major depressive disorder, recurrent, moderate: Secondary | ICD-10-CM

## 2014-06-02 ENCOUNTER — Ambulatory Visit (INDEPENDENT_AMBULATORY_CARE_PROVIDER_SITE_OTHER): Payer: BC Managed Care – PPO | Admitting: Family Medicine

## 2014-06-02 ENCOUNTER — Encounter: Payer: Self-pay | Admitting: Family Medicine

## 2014-06-02 VITALS — BP 112/64 | HR 96 | Temp 98.5°F | Resp 16 | Wt 113.1 lb

## 2014-06-02 DIAGNOSIS — F330 Major depressive disorder, recurrent, mild: Secondary | ICD-10-CM

## 2014-06-02 MED ORDER — DULOXETINE HCL 30 MG PO CPEP: 30.0000 mg | ORAL_CAPSULE | Freq: Every day | ORAL | Status: DC

## 2014-06-02 NOTE — Patient Instructions (Signed)
Follow up in 1 month to recheck mood Start the Cymbalta daily in the AM Continue the counseling- this is fantastic for you Call with any questions or concerns You can do this!!!

## 2014-06-02 NOTE — Progress Notes (Signed)
Pre visit review using our clinic review tool, if applicable. No additional management support is needed unless otherwise documented below in the visit note. 

## 2014-06-02 NOTE — Assessment & Plan Note (Signed)
Deteriorated.  Not currently on any medication but recently restarted counseling.  Applauded this decision.  Will start Cymbalta at lower dose as pt feels this was the most effective of the treatments she has been on- but at higher doses, 'it made me crazy'.  Discussed w/ pt that given her hx, I have a low threshold to send her back to psych.  Pt agreeable to this.  Pt able to contract for safety.  Will follow closely.

## 2014-06-02 NOTE — Progress Notes (Signed)
   Subjective:    Patient ID: Carolyn Rasmussen, female    DOB: 05/06/1981, 33 y.o.   MRN: [id number]  HPI Depression- pt has dropped from 158 (11/13) to 113 today.  Pt reports stress level has increased, switched jobs (now works at [employer]) which is burning more calories.  Pt has started counseling.  Having 'mood swings'.  Pt reports the anxiety isn't severe but the depression is.  Not eating or sleeping well.  Now smoking pot to 'help w/ the mood swings'.  [facility] Cymbalta[facility]  Was told that she couldn't be seen there any more b/c she was smoking pot.   Review of Systems For ROS see HPI     Objective:   Physical Exam  Vitals reviewed. Constitutional: She is oriented to person, place, and time. She appears well-developed and well-nourished. No distress.  HENT:  Head: Normocephalic and atraumatic.  Neurological: She is alert and oriented to person, place, and time. No cranial nerve deficit. Coordination normal.  Skin: Skin is warm and dry.  Scars on both arms from cutting  Psychiatric: Her behavior is normal. Thought content normal.  Flat affect, withdrawn          Assessment & Plan:

## 2014-06-08 ENCOUNTER — Ambulatory Visit (INDEPENDENT_AMBULATORY_CARE_PROVIDER_SITE_OTHER): Payer: BC Managed Care – PPO | Admitting: Psychology

## 2014-06-08 DIAGNOSIS — F411 Generalized anxiety disorder: Secondary | ICD-10-CM

## 2014-06-08 DIAGNOSIS — F1920 Other psychoactive substance dependence, uncomplicated: Secondary | ICD-10-CM

## 2014-06-24 ENCOUNTER — Ambulatory Visit (INDEPENDENT_AMBULATORY_CARE_PROVIDER_SITE_OTHER): Payer: BC Managed Care – PPO | Admitting: Psychology

## 2014-06-24 DIAGNOSIS — F411 Generalized anxiety disorder: Secondary | ICD-10-CM

## 2014-06-24 DIAGNOSIS — F1920 Other psychoactive substance dependence, uncomplicated: Secondary | ICD-10-CM

## 2014-07-06 ENCOUNTER — Ambulatory Visit (INDEPENDENT_AMBULATORY_CARE_PROVIDER_SITE_OTHER): Payer: BC Managed Care – PPO | Admitting: Psychology

## 2014-07-06 DIAGNOSIS — F411 Generalized anxiety disorder: Secondary | ICD-10-CM

## 2014-07-27 ENCOUNTER — Ambulatory Visit: Payer: BC Managed Care – PPO | Admitting: Psychology

## 2014-08-17 ENCOUNTER — Telehealth: Payer: Self-pay | Admitting: Family Medicine

## 2014-08-17 MED ORDER — DULOXETINE HCL 30 MG PO CPEP: 30.0000 mg | ORAL_CAPSULE | Freq: Every day | ORAL | Status: DC

## 2014-08-17 NOTE — Addendum Note (Signed)
Addended by: Jackson Latino on: 08/17/2014 04:36 PM   Modules accepted: Orders

## 2014-08-17 NOTE — Telephone Encounter (Signed)
She needs duloxetine [facility] to [facility] [address]  Take 1 per day

## 2014-08-17 NOTE — Telephone Encounter (Signed)
Insurance will only cover 90 day supply

## 2014-08-17 NOTE — Telephone Encounter (Signed)
Med filled.  

## 2014-11-14 ENCOUNTER — Other Ambulatory Visit: Payer: Self-pay | Admitting: General Practice

## 2014-11-14 MED ORDER — DULOXETINE HCL 30 MG PO CPEP: 30.0000 mg | ORAL_CAPSULE | Freq: Every day | ORAL | Status: DC

## 2014-11-14 NOTE — Telephone Encounter (Signed)
Med filled.  

## 2015-02-12 ENCOUNTER — Emergency Department (HOSPITAL_BASED_OUTPATIENT_CLINIC_OR_DEPARTMENT_OTHER): Payer: BLUE CROSS/BLUE SHIELD

## 2015-02-12 ENCOUNTER — Encounter (HOSPITAL_BASED_OUTPATIENT_CLINIC_OR_DEPARTMENT_OTHER): Payer: Self-pay | Admitting: Emergency Medicine

## 2015-02-12 ENCOUNTER — Emergency Department (HOSPITAL_BASED_OUTPATIENT_CLINIC_OR_DEPARTMENT_OTHER)
Admission: EM | Admit: 2015-02-12 | Discharge: 2015-02-12 | Disposition: A | Discharge status: Home / Self Care | Payer: BLUE CROSS/BLUE SHIELD | Attending: Emergency Medicine | Admitting: Emergency Medicine

## 2015-02-12 DIAGNOSIS — Z720 Tobacco use: Secondary | ICD-10-CM | POA: Insufficient documentation

## 2015-02-12 DIAGNOSIS — N189 Chronic kidney disease, unspecified: Secondary | ICD-10-CM | POA: Insufficient documentation

## 2015-02-12 DIAGNOSIS — W01198A Fall on same level from slipping, tripping and stumbling with subsequent striking against other object, initial encounter: Secondary | ICD-10-CM | POA: Insufficient documentation

## 2015-02-12 DIAGNOSIS — Y9389 Activity, other specified: Secondary | ICD-10-CM | POA: Insufficient documentation

## 2015-02-12 DIAGNOSIS — S8991XA Unspecified injury of right lower leg, initial encounter: Secondary | ICD-10-CM | POA: Diagnosis present

## 2015-02-12 DIAGNOSIS — Z79899 Other long term (current) drug therapy: Secondary | ICD-10-CM | POA: Diagnosis not present

## 2015-02-12 DIAGNOSIS — Z8619 Personal history of other infectious and parasitic diseases: Secondary | ICD-10-CM | POA: Diagnosis not present

## 2015-02-12 DIAGNOSIS — F419 Anxiety disorder, unspecified: Secondary | ICD-10-CM | POA: Diagnosis not present

## 2015-02-12 DIAGNOSIS — Y998 Other external cause status: Secondary | ICD-10-CM | POA: Diagnosis not present

## 2015-02-12 DIAGNOSIS — Y9289 Other specified places as the place of occurrence of the external cause: Secondary | ICD-10-CM | POA: Diagnosis not present

## 2015-02-12 DIAGNOSIS — Z8719 Personal history of other diseases of the digestive system: Secondary | ICD-10-CM | POA: Diagnosis not present

## 2015-02-12 DIAGNOSIS — F329 Major depressive disorder, single episode, unspecified: Secondary | ICD-10-CM | POA: Diagnosis not present

## 2015-02-12 DIAGNOSIS — T148XXA Other injury of unspecified body region, initial encounter: Secondary | ICD-10-CM

## 2015-02-12 DIAGNOSIS — S8001XA Contusion of right knee, initial encounter: Secondary | ICD-10-CM | POA: Insufficient documentation

## 2015-02-12 MED ORDER — MELOXICAM 15 MG PO TABS: 15.0000 mg | ORAL_TABLET | Freq: Every day | ORAL | Status: DC

## 2015-02-12 MED ORDER — IBUPROFEN 800 MG PO TABS
800.0000 mg | ORAL_TABLET | Freq: Once | ORAL | Status: AC
  Administered 2015-02-12: 800 mg via ORAL
  Filled 2015-02-12: qty 1

## 2015-02-12 NOTE — ED Notes (Signed)
Pt reports fall onto right knee Wednesday past pain has increased and notes cripitus

## 2015-02-12 NOTE — Discharge Instructions (Signed)
Cryotherapy °Cryotherapy means treatment with cold. Ice or gel packs can be used to reduce both pain and swelling. Ice is the most helpful within the first 24 to 48 hours after an injury or flare-up from overusing a muscle or joint. Sprains, strains, spasms, burning pain, shooting pain, and aches can all be eased with ice. Ice can also be used when recovering from surgery. Ice is effective, has very few side effects, and is safe for most people to use. °PRECAUTIONS  °Ice is not a safe treatment option for people with: °· Raynaud phenomenon. This is a condition affecting small blood vessels in the extremities. Exposure to cold may cause your problems to return. °· Cold hypersensitivity. There are many forms of cold hypersensitivity, including: °¨ Cold urticaria. Red, itchy hives appear on the skin when the tissues begin to warm after being iced. °¨ Cold erythema. This is a red, itchy rash caused by exposure to cold. °¨ Cold hemoglobinuria. Red blood cells break down when the tissues begin to warm after being iced. The hemoglobin that carry oxygen are passed into the urine because they cannot combine with blood proteins fast enough. °· Numbness or altered sensitivity in the area being iced. °If you have any of the following conditions, do not use ice until you have discussed cryotherapy with your caregiver: °· Heart conditions, such as arrhythmia, angina, or chronic heart disease. °· High blood pressure. °· Healing wounds or open skin in the area being iced. °· Current infections. °· Rheumatoid arthritis. °· Poor circulation. °· Diabetes. °Ice slows the blood flow in the region it is applied. This is beneficial when trying to stop inflamed tissues from spreading irritating chemicals to surrounding tissues. However, if you expose your skin to cold temperatures for too long or without the proper protection, you can damage your skin or nerves. Watch for signs of skin damage due to cold. °HOME CARE INSTRUCTIONS °Follow  these tips to use ice and cold packs safely. °· Place a dry or damp towel between the ice and skin. A damp towel will cool the skin more quickly, so you may need to shorten the time that the ice is used. °· For a more rapid response, add gentle compression to the ice. °· Ice for no more than 10 to 20 minutes at a time. The bonier the area you are icing, the less time it will take to get the benefits of ice. °· Check your skin after 5 minutes to make sure there are no signs of a poor response to cold or skin damage. °· Rest 20 minutes or more between uses. °· Once your skin is numb, you can end your treatment. You can test numbness by very lightly touching your skin. The touch should be so light that you do not see the skin dimple from the pressure of your fingertip. When using ice, most people will feel these normal sensations in this order: cold, burning, aching, and numbness. °· Do not use ice on someone who cannot communicate their responses to pain, such as small children or people with dementia. °HOW TO MAKE AN ICE PACK °Ice packs are the most common way to use ice therapy. Other methods include ice massage, ice baths, and cryosprays. Muscle creams that cause a cold, tingly feeling do not offer the same benefits that ice offers and should not be used as a substitute unless recommended by your caregiver. °To make an ice pack, do one of the following: °· Place crushed ice or a   bag of frozen vegetables in a sealable plastic bag. Squeeze out the excess air. Place this bag inside another plastic bag. Slide the bag into a pillowcase or place a damp towel between your skin and the bag. °· Mix 3 parts water with 1 part rubbing alcohol. Freeze the mixture in a sealable plastic bag. When you remove the mixture from the freezer, it will be slushy. Squeeze out the excess air. Place this bag inside another plastic bag. Slide the bag into a pillowcase or place a damp towel between your skin and the bag. °SEEK MEDICAL CARE  IF: °· You develop white spots on your skin. This may give the skin a blotchy (mottled) appearance. °· Your skin turns blue or pale. °· Your skin becomes waxy or hard. °· Your swelling gets worse. °MAKE SURE YOU:  °· Understand these instructions. °· Will watch your condition. °· Will get help right away if you are not doing well or get worse. °Document Released: 07/22/2011 Document Revised: 04/11/2014 Document Reviewed: 07/22/2011 °Exit[employer]® Patient Information ©2015 [employer]. This information is not intended to replace advice given to you by your health care provider. Make sure you discuss any questions you have with your health care provider. ° °

## 2015-02-12 NOTE — ED Provider Notes (Addendum)
CSN: [id number]     Arrival date & time 02/12/15  2124 History  This chart was scribed for [name], MD by [name], ED Scribe. This patient was seen in room MHFT1/MHFT1 and the patient's care was started 11:31 PM.    Chief Complaint  Patient presents with  . Knee Injury   Patient is a 34 y.o. female presenting with knee pain. The history is provided by the patient. No language interpreter was used.  Knee Pain Location:  Knee Time since incident:  5 days Injury: yes   Mechanism of injury: fall   Fall:    Fall occurred:  Standing   Entrapped after fall: no   Knee location:  R knee Pain details:    Quality:  Aching   Radiates to:  Does not radiate   Severity:  Severe   Onset quality:  Sudden   Timing:  Constant   Progression:  Unchanged Chronicity:  New Dislocation: no   Relieved by:  Nothing Worsened by:  Nothing tried Ineffective treatments:  None tried Associated symptoms: no back pain   Risk factors: no concern for non-accidental trauma    HPI Comments: Carolyn Rasmussen is a 34 y.o. female with a history of GERD who presents to the Emergency Department complaining of right knee pain that started from a fall 5 days ago. Pt reports she was pushing on her kneecap and felt a "crackle." pt states she has tried no medications for her knee pain. She reports no modifying or alleviating factors at this time. Pt reports no pertinent past medical history at this time.    Past Medical History  Diagnosis Date  . Depression   . Allergy   . GERD (gastroesophageal reflux disease)   . Chronic kidney disease 2002    kidney failure secondary to dehy./ibuprof  . Anxiety   . Genital herpes    Past Surgical History  Procedure Laterality Date  . Tubes in ears      x4  . Typanoplasty    . Kidney stone surgery     Family History  Problem Relation Age of Onset  . Hypertension Mother   . Hyperlipidemia Father   . Heart disease Father   . Anxiety disorder  Maternal Grandfather   . Anxiety disorder Maternal Grandmother   . Anxiety disorder Paternal Grandfather   . Anxiety disorder Paternal Grandmother    History  Substance Use Topics  . Smoking status: Passive Smoke Exposure - Never Smoker -- 0.30 packs/day  . Smokeless tobacco: Current User     Comment: 3 cigerettes daily for 3 months   . Alcohol Use: 0.0 oz/week     Comment: Pt was binge drinking 2-3 times a week-- Quit in July 2013   OB History    No data available     Review of Systems  Musculoskeletal: Positive for arthralgias. Negative for back pain.  All other systems reviewed and are negative.  Allergies  Sulfa antibiotics and Ceftin  Home Medications   Prior to Admission medications   Medication Sig Start Date End Date Taking? Authorizing Provider  DULoxetine (CYMBALTA) 30 MG capsule Take 1 capsule (30 mg total) by mouth daily. 11/14/14   Sheliah Hatch, MD  valACYclovir (VALTREX) 1000 MG tablet Take 500 mg by mouth daily.    Historical Provider, MD   BP 118/85 mmHg  Pulse 72  Temp(Src) 98.3 F (36.8 C) (Oral)  SpO2 100% Physical Exam  Constitutional: She is oriented to person, place,  and time. She appears well-developed and well-nourished.  HENT:  Head: Normocephalic and atraumatic. Head is without raccoon's eyes and without Battle's sign.  Mouth/Throat: Oropharynx is clear and moist.  Eyes: Conjunctivae are normal. Pupils are equal, round, and reactive to light. Right eye exhibits no discharge. Left eye exhibits no discharge.  Neck: Normal range of motion. Neck supple. No tracheal deviation present.  Cardiovascular: Normal rate, regular rhythm and normal heart sounds.   Pulmonary/Chest: Effort normal. No respiratory distress. She has no wheezes. She has no rales.  Abdominal: Soft. Bowel sounds are normal. She exhibits no distension. There is no tenderness. There is no rebound and no guarding.  Musculoskeletal: Normal range of motion. She exhibits no edema.        Right knee: Normal. She exhibits normal range of motion, no swelling, no effusion, no ecchymosis, no deformity, no laceration, no erythema, normal alignment, no LCL laxity, normal patellar mobility, no bony tenderness and normal meniscus. No tenderness found. No medial joint line, no lateral joint line, no MCL, no LCL and no patellar tendon tenderness noted.  No effusion. No tibial plateau tenderness. No laxity. Negative anterior and posterior drawer. No patella alta or baja. Patellar tendon intact. Hematoma over the skin over the patella.   Neurological: She is alert and oriented to person, place, and time.  Skin: Skin is warm and dry.  Psychiatric: She has a normal mood and affect.  Nursing note and vitals reviewed.   ED Course  Procedures   DIAGNOSTIC STUDIES: Oxygen Saturation is 100% on RA, normal by my interpretation.    COORDINATION OF CARE: 11:35 PM Discussed treatment plan with pt at bedside and pt agreed to plan.   Labs Review Labs Reviewed - No data to display  Imaging Review Dg Knee Complete 4 Views Right  02/12/2015   CLINICAL DATA:  Fall 4 days prior hitting right knee on the ground. Increasing pain since that time.  EXAM: RIGHT KNEE - COMPLETE 4+ VIEW  COMPARISON:  None.  FINDINGS: No fracture or dislocation. The alignment and joint spaces are maintained. Bone mineralization is normal. There is no joint effusion. There is mild soft tissue edema.  IMPRESSION: No fracture or dislocation of the right knee.   Electronically Signed   By: Rubye OaksMelanie  Ehinger M.D.   On: 02/12/2015 22:06     EKG Interpretation None      MDM   Final diagnoses:  None  Contusion: ice and elevation and tylenol alternating with ibuprofen. PRN follow up with Norton BlizzardShane Hudnall  I personally performed the services described in this documentation, which was scribed in my presence. The recorded information has been reviewed and is accurate.      Jasmine AweApril K Salim Forero-Rasch, MD 02/13/15 0003  Eulogio Requena K  Aaliyana Fredericks-Rasch, MD 02/13/15 0006  [name], MD 02/13/15 [id number]

## 2015-02-14 ENCOUNTER — Encounter: Payer: Self-pay | Admitting: Family Medicine

## 2015-02-14 ENCOUNTER — Ambulatory Visit (INDEPENDENT_AMBULATORY_CARE_PROVIDER_SITE_OTHER): Payer: BLUE CROSS/BLUE SHIELD | Admitting: Family Medicine

## 2015-02-14 VITALS — BP 127/74 | HR 102 | Ht 64.0 in | Wt 125.0 lb

## 2015-02-14 DIAGNOSIS — S8991XA Unspecified injury of right lower leg, initial encounter: Secondary | ICD-10-CM

## 2015-02-14 NOTE — Patient Instructions (Signed)
You have a knee contusion and traumatic prepatellar bursitis. Icing, ibuprofen or aleve, elevation for swelling. Expect it to take 2-3 weeks for the bruising and swelling to resolve. Sleeve for support and compression. All activities as tolerated - no restrictions. Follow up with me as needed.

## 2015-02-17 DIAGNOSIS — S8991XA Unspecified injury of right lower leg, initial encounter: Secondary | ICD-10-CM | POA: Insufficient documentation

## 2015-02-17 NOTE — Progress Notes (Signed)
PCP: Neena RhymesKatherine Tabori, MD  Subjective:   HPI: Patient is a 34 y.o. female here for right knee pain.  Patient reports on 3/2 her left foot was caught in a strap and she fell directly onto her right knee. + bruising and swelling. Having popping and crackling noises since then. Feels hot to the touch. Radiographs negative for fracture. No prior injuries. No catching, locking, giving out.  Past Medical History  Diagnosis Date  . Depression   . Allergy   . GERD (gastroesophageal reflux disease)   . Chronic kidney disease 2002    kidney failure secondary to dehy./ibuprof  . Anxiety   . Genital herpes     Current Outpatient Prescriptions on File Prior to Visit  Medication Sig Dispense Refill  . DULoxetine (CYMBALTA) 30 MG capsule Take 1 capsule (30 mg total) by mouth daily. 90 capsule 0  . meloxicam (MOBIC) 15 MG tablet Take 1 tablet (15 mg total) by mouth daily. 10 tablet 0  . valACYclovir (VALTREX) 1000 MG tablet Take 500 mg by mouth daily.    . [DISCONTINUED] esomeprazole (NEXIUM) 40 MG capsule Take 1 capsule (40 mg total) by mouth daily before breakfast. 30 capsule 0   No current facility-administered medications on file prior to visit.    Past Surgical History  Procedure Laterality Date  . Tubes in ears      x4  . Typanoplasty    . Kidney stone surgery      Allergies  Allergen Reactions  . Sulfa Antibiotics     Unknown, childhood allergy  . Ceftin [Cefuroxime Axetil] Rash    History   Social History  . Marital Status: Divorced    Spouse Name: N/A  . Number of Children: 2  . 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 Smoking status: Passive Smoke Exposure - Never Smoker -- 0.30 packs/day  . Smokeless tobacco: Current User     Comment: 3 cigerettes daily for 3 months   . Alcohol Use: 0.0 oz/week    0 Standard drinks or equivalent per week     Comment: Pt was binge drinking 2-3 times a week-- Quit in July  2013  . Drug Use: Yes    Special: Marijuana  . Sexual Activity:    Partners: Male    Pharmacist, hospitalBirth Control/ Protection: None   Other Topics Concern  . Not on file   Social History Narrative    Family History  Problem Relation Age of Onset  . Hypertension Mother   . Hyperlipidemia Father   . Heart disease Father   . Anxiety disorder Maternal Grandfather   . Anxiety disorder Maternal Grandmother   . Anxiety disorder Paternal Grandfather   . Anxiety disorder Paternal Grandmother     BP 127/74 mmHg  Pulse 102  Ht 5\' 4"  (1.626 m)  Wt 125 lb (56.7 kg)  BMI 21.45 kg/m2  Review of Systems: See HPI above.    Objective:  Physical Exam:  Gen: NAD  Right knee: Mild swelling prepatellar area.  + bruising.  No other deformity.  No effusion. TTP over prepatellar bursa, less medial joint line anteriorly.  No other tenderness. FROM. Negative ant/post drawers. Negative valgus/varus testing. Negative lachmanns. Negative mcmurrays, apleys, patellar apprehension. NV intact distally.    Assessment & Plan:  1. Right knee injury - consistent with traumatic prepatellar bursitis and contusion.  Icing, nsaids, elevation.  Compression sleeve or ace wrap.  Activities  as tolerated.  Radiographs reassuring.  F/u prn.  Call with any concerns.

## 2015-02-17 NOTE — Assessment & Plan Note (Signed)
consistent with traumatic prepatellar bursitis and contusion.  Icing, nsaids, elevation.  Compression sleeve or ace wrap.  Activities as tolerated.  Radiographs reassuring.  F/u prn.  Call with any concerns.

## 2015-05-23 ENCOUNTER — Telehealth: Payer: Self-pay | Admitting: Family Medicine

## 2015-05-23 NOTE — Telephone Encounter (Signed)
Patient Name: Carolyn Rasmussen  DOB: [birth year]    Initial Comment Caller states hemorrhoids have been bleeding for 4 days    Nurse Assessment  Nurse: Scarlette Ar, RN, Heather Date/Time (Eastern Time): 05/23/2015 1:27:44 PM  Confirm and document reason for call. If symptomatic, describe symptoms. ---Caller states hemorrhoids have been leaking blood for 4 days, it has been painful for a while.  Has the patient traveled out of the country within the last 30 days? ---Not Applicable  Does the patient require triage? ---Yes  Related visit to physician within the last 2 weeks? ---No  Does the PT have any chronic conditions? (i.e. diabetes, asthma, etc.) ---No  Did the patient indicate they were pregnant? ---No     Guidelines    Guideline Title Affirmed Question Affirmed Notes  Rectal Symptoms [1] Home treatment > 3 days for rectal pain AND [2] not improved    Final Disposition User   See PCP When Office is Open (within 3 days) Standifer, RN, [employer]    Comments  appt made for tomorrow at 2:45 pm with [employer].

## 2015-05-24 ENCOUNTER — Encounter: Payer: Self-pay | Admitting: Medical

## 2015-05-24 ENCOUNTER — Ambulatory Visit (HOSPITAL_COMMUNITY)
Admission: RE | Admit: 2015-05-24 | Discharge: 2015-05-24 | Disposition: A | Discharge status: Home / Self Care | Payer: BLUE CROSS/BLUE SHIELD | Source: Ambulatory Visit | Attending: Medical | Admitting: Medical

## 2015-05-24 ENCOUNTER — Telehealth: Payer: Self-pay | Admitting: Family Medicine

## 2015-05-24 ENCOUNTER — Ambulatory Visit (HOSPITAL_BASED_OUTPATIENT_CLINIC_OR_DEPARTMENT_OTHER)
Admission: RE | Admit: 2015-05-24 | Discharge: 2015-05-24 | Disposition: A | Discharge status: Home / Self Care | Payer: BLUE CROSS/BLUE SHIELD | Source: Ambulatory Visit | Attending: Medical | Admitting: Medical

## 2015-05-24 ENCOUNTER — Ambulatory Visit (INDEPENDENT_AMBULATORY_CARE_PROVIDER_SITE_OTHER): Payer: BLUE CROSS/BLUE SHIELD | Admitting: Medical

## 2015-05-24 VITALS — BP 110/70 | HR 103 | Temp 99.3°F | Ht 64.0 in | Wt 137.6 lb

## 2015-05-24 DIAGNOSIS — M5432 Sciatica, left side: Secondary | ICD-10-CM

## 2015-05-24 DIAGNOSIS — M4186 Other forms of scoliosis, lumbar region: Secondary | ICD-10-CM | POA: Insufficient documentation

## 2015-05-24 DIAGNOSIS — S0300XA Dislocation of jaw, unspecified side, initial encounter: Secondary | ICD-10-CM | POA: Insufficient documentation

## 2015-05-24 DIAGNOSIS — K648 Other hemorrhoids: Secondary | ICD-10-CM | POA: Diagnosis not present

## 2015-05-24 DIAGNOSIS — M2669 Other specified disorders of temporomandibular joint: Secondary | ICD-10-CM

## 2015-05-24 DIAGNOSIS — M545 Low back pain: Secondary | ICD-10-CM | POA: Diagnosis present

## 2015-05-24 DIAGNOSIS — K649 Unspecified hemorrhoids: Secondary | ICD-10-CM | POA: Insufficient documentation

## 2015-05-24 DIAGNOSIS — F4323 Adjustment disorder with mixed anxiety and depressed mood: Secondary | ICD-10-CM

## 2015-05-24 DIAGNOSIS — M26629 Arthralgia of temporomandibular joint, unspecified side: Secondary | ICD-10-CM

## 2015-05-24 DIAGNOSIS — M47896 Other spondylosis, lumbar region: Secondary | ICD-10-CM | POA: Diagnosis not present

## 2015-05-24 MED ORDER — DICLOFENAC SODIUM 75 MG PO TBEC
75.0000 mg | DELAYED_RELEASE_TABLET | Freq: Two times a day (BID) | ORAL | Status: DC
Start: 2015-05-24 — End: 2015-06-13

## 2015-05-24 MED ORDER — HYDROCORTISONE ACETATE 25 MG RE SUPP: 25.0000 mg | Freq: Two times a day (BID) | RECTAL | Status: DC

## 2015-05-24 MED ORDER — DULOXETINE HCL 30 MG PO CPEP: ORAL_CAPSULE | ORAL | Status: DC

## 2015-05-24 NOTE — Assessment & Plan Note (Signed)
Advise conservative measure. Will try to order xray of tmj. Rx  Diclofenac. 

## 2015-05-24 NOTE — Assessment & Plan Note (Signed)
Rx annusol hx. Sitz bath soaks.Avoid constipation.If area persist or worsen then refer to specialist.

## 2015-05-24 NOTE — Patient Instructions (Addendum)
TMJ pain dysfunction syndrome Advise conservative measure. Will try to order xray of tmj. Rx  Diclofenac  Back pain with left-sided sciatica Back exercises, diclofenac, and xray of lspine.  Adjustment disorder with mixed anxiety and depressed mood Restart cymbalta  Hemorrhoid Rx annusol hx. Sitz bath soaks.Avoid constipation.If area persist or worsen then refer to specialist.    Follow up in one month or as needed

## 2015-05-24 NOTE — Telephone Encounter (Signed)
Patient scheduled with Esperanza Richters, PA today.

## 2015-05-24 NOTE — Progress Notes (Signed)
Subjective:    Patient ID: Carolyn Rasmussen, female    DOB: [birth year], 34 y.o.   MRN: [id number]  HPI  Pt in with hx of hemorrhoids. Off and on itching since Friday. Some pain over weekend and some bleeding on Friday.Very light and intermittnet since then. No constipation.  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 Has seen chiropracter. Some reported pain radiating to lt leg at times. She sometimes pops her back. Pt never mentioned this to her pcp. Told by chiropracter has scoliosis. Pain comes and goes. Recent moderate level.  Pt has rt tmj area pain for 3 wks. Recent mild swelling and hx of popping. Pt seen dentist and they told tmj. Last time xrays done 2 yrs ago.  Pt has history of depression and anxiety. She was on cymbalta in the past. She wheened her self off of recently after she stopped drinking alcohol.  She stopped drinking completely 4 months.  Pt not on cymbalta for 3 wks. She is starting to feel anxious again. She want to restart. No homicidal or suicidal ideations.  LMP- on mirena.     Review of Systems  Constitutional: Negative for fever, chills, diaphoresis, activity change and fatigue.  Respiratory: Negative for cough, chest tightness and shortness of breath.   Cardiovascular: Negative for chest pain, palpitations and leg swelling.  Gastrointestinal: Negative for nausea, vomiting and abdominal pain.       Hemorrhoid type pain described.  Musculoskeletal: Positive for back pain. Negative for neck pain and neck stiffness.       Rt tmj pain.  Neurological: Negative for dizziness, tremors, seizures, syncope, facial asymmetry, speech difficulty, weakness, light-headedness, numbness and headaches.  Psychiatric/Behavioral: Negative for suicidal ideas, behavioral problems, confusion and agitation. The patient is nervous/anxious.     Past Medical History  Diagnosis Date  . Depression   . Allergy   . GERD (gastroesophageal  reflux disease)   . Chronic kidney disease 2002    kidney failure secondary to dehy./ibuprof  . Anxiety   . Genital herpes     History   Social History  . Marital Status: Divorced    Spouse Name: N/A  . Number of Children: 2  . 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 Smoking status: Former Smoker -- 0.30 packs/day  . Smokeless tobacco: Current User     Comment: 3 cigerettes daily for 3 months   . Alcohol Use: No     Comment: Pt was binge drinking 2-3 times a week-- Quit in July 2013  . Drug Use: Yes    Special: Marijuana  . Sexual Activity:    Partners: Male    Pharmacist, hospital Protection: None   Other Topics Concern  . Not on file   Social History Narrative    Past Surgical History  Procedure Laterality Date  . Tubes in ears      x4  . Typanoplasty    . Kidney stone surgery      Family History  Problem Relation Age of Onset  . Hypertension Mother   . Hyperlipidemia Father   . Heart disease Father   . Anxiety disorder Maternal Grandfather   . Anxiety disorder Maternal Grandmother   . Anxiety disorder Paternal Grandfather   . Anxiety disorder Paternal Grandmother     Allergies  Allergen Reactions  . Sulfa Antibiotics  Unknown, childhood allergy  . Ceftin [Cefuroxime Axetil] Rash    Current Outpatient Prescriptions on File Prior to Visit  Medication Sig Dispense Refill  . valACYclovir (VALTREX) 1000 MG tablet Take 500 mg by mouth daily.    . [DISCONTINUED] esomeprazole (NEXIUM) 40 MG capsule Take 1 capsule (40 mg total) by mouth daily before breakfast. 30 capsule 0   No current facility-administered medications on file prior to visit.    BP 110/70 mmHg  Pulse 103  Temp(Src) 99.3 F (37.4 C) (Oral)  Ht  (1.626 m)  Wt 137 lb 9.6 oz (62.415 kg)  BMI 23.61 kg/m2  SpO2 100%       Objective:   Physical Exam   General  Mental Status - Alert. General Appearance - Well groomed. Not in  acute distress.  Skin Rashes- No Rashes.  HEENT Head- Normal. Ear Auditory Canal - Left- Normal. Right - Normal.Tympanic Membrane- Left- Normal. Right- Normal. Eye Sclera/Conjunctiva- Left- Normal. Right- Normal. Nose & Sinuses Nasal Mucosa- Left-  Not oggy or Congested. Right-  Not  boggy or Congested. Mouth & Throat Lips: Upper Lip- Normal: no dryness, cracking, pallor, cyanosis, or vesicular eruption. Lower Lip-Normal: no dryness, cracking, pallor, cyanosis or vesicular eruption. Buccal Mucosa- Bilateral- No Aphthous ulcers. Oropharynx- No Discharge or Erythema. Tonsils: Characteristics- Bilateral- No Erythema or Congestion. Size/Enlargement- Bilateral- No enlargement. Discharge- bilateral-None.  Rt tmj area on opening and closing seems to be misaligned but not dislocated.   Neck Neck- Supple. No Masses.   Chest and Lung Exam Auscultation: Breath Sounds:- even and unlabored, but bilateral upper lobe rhonchi.  Cardiovascular Auscultation:Rythm- Regular, rate and rhythm. Murmurs & Other Heart Sounds:Ausculatation of the heart reveal- No Murmurs.  Lymphatic Head & Neck   Abdomen Inspection:-Inspection Normal.  Palpation/Perucssion: Palpation and Percussion of the abdomen reveal- Non Tender, No Rebound tenderness, No rigidity(Guarding) and No Palpable abdominal masses.  Liver:-Normal.  Spleen:- Normal.   Rectal Anorectal Exam: Stool -  External - normal external exam except rt side 3 oclock postion small hemorrhoid not thrombosed presently.  General Head & Neck Lymphatics: Bilateral: Description- No Localized lymphadenopathy.     Back Mid lumbar spine tenderness to palpation. And some lt si tenderness Pain on straight leg lift. Pain on lateral movements and flexion/extension of the spine.  Lower ext neurologic  L5-S1 sensation intact bilaterally. Normal patellar reflexes bilaterally. No foot drop bilaterally.      Assessment & Plan:  Called pharmacy and  canceled pt refills cymbalta since I do want to see her for follow up one month.

## 2015-05-24 NOTE — Telephone Encounter (Signed)
error:[id number] ° °

## 2015-05-24 NOTE — Assessment & Plan Note (Signed)
Restart cymbalta 

## 2015-05-24 NOTE — Assessment & Plan Note (Deleted)
Vitals with BMI 05/24/2015  Weight

## 2015-05-24 NOTE — Progress Notes (Signed)
Pre visit review using our clinic review tool, if applicable. No additional management support is needed unless otherwise documented below in the visit note. 

## 2015-05-24 NOTE — Assessment & Plan Note (Deleted)
Advise conservative measure. Will try to order xray of tmj. Rx  Diclofenac.

## 2015-05-24 NOTE — Assessment & Plan Note (Signed)
Back exercises, diclofenac, and xray of lspine.

## 2015-05-25 ENCOUNTER — Other Ambulatory Visit: Payer: Self-pay | Admitting: Medical

## 2015-05-25 ENCOUNTER — Ambulatory Visit (HOSPITAL_COMMUNITY)
Admission: RE | Admit: 2015-05-25 | Discharge: 2015-05-25 | Disposition: A | Discharge status: Home / Self Care | Payer: BLUE CROSS/BLUE SHIELD | Source: Ambulatory Visit | Attending: Medical | Admitting: Medical

## 2015-05-25 DIAGNOSIS — M26629 Arthralgia of temporomandibular joint, unspecified side: Secondary | ICD-10-CM

## 2015-05-25 DIAGNOSIS — M2662 Arthralgia of temporomandibular joint: Secondary | ICD-10-CM | POA: Diagnosis not present

## 2015-06-13 ENCOUNTER — Other Ambulatory Visit: Payer: Self-pay | Admitting: Medical

## 2015-06-13 DIAGNOSIS — M545 Low back pain, unspecified: Secondary | ICD-10-CM

## 2015-06-15 NOTE — Telephone Encounter (Signed)
Called patient to let her know Diclofenac has been refill,however,if pain persists she needs to schedule appointment.

## 2015-06-15 NOTE — Telephone Encounter (Signed)
Left message for patient referral to PT placed.

## 2015-06-15 NOTE — Telephone Encounter (Signed)
Low back pain. Will refer to PT.

## 2015-06-27 ENCOUNTER — Ambulatory Visit: Payer: BLUE CROSS/BLUE SHIELD | Attending: Medical | Admitting: Physical Therapy

## 2015-06-27 DIAGNOSIS — M545 Low back pain: Secondary | ICD-10-CM | POA: Insufficient documentation

## 2015-06-27 DIAGNOSIS — M5442 Lumbago with sciatica, left side: Secondary | ICD-10-CM | POA: Insufficient documentation

## 2015-06-27 DIAGNOSIS — M533 Sacrococcygeal disorders, not elsewhere classified: Secondary | ICD-10-CM | POA: Diagnosis present

## 2015-06-28 ENCOUNTER — Telehealth: Payer: Self-pay | Admitting: Medical

## 2015-06-28 NOTE — Therapy (Signed)
[facility] [address]  [address] [place], [place], [address] Phone: [phone]   Fax:  [phone]  Physical Therapy Evaluation  Patient Details  Name: Carolyn Rasmussen MRN: [id number] Date of Birth: 1981/06/07 Referring Provider:  Esperanza Richters, PA-C  Encounter Date: 06/27/2015      PT End of Session - 06/27/15 1550    Visit Number 1   Number of Visits 16   Date for PT Re-Evaluation 08/22/15   PT Start Time 1445   PT Stop Time 1538   PT Time Calculation (min) 53 min   Activity Tolerance Patient tolerated treatment well   Behavior During Therapy [facility] for tasks assessed/performed      Past Medical History  Diagnosis Date  . Depression   . Allergy   . GERD (gastroesophageal reflux disease)   . Chronic kidney disease 2002    kidney failure secondary to dehy./ibuprof  . Anxiety   . Genital herpes     Past Surgical History  Procedure Laterality Date  . Tubes in ears      x4  . Typanoplasty    . Kidney stone surgery      There were no vitals filed for this visit.  Visit Diagnosis:  Left low back pain, with sciatica presence unspecified      Subjective Assessment - 06/27/15 1453    Subjective Had back pain fo a while and finally decided to see a MD to see if there was a disc problem. Chiropracter told her she has arthritis and x-ray revealed degenerative changes. 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   Limitations Other (comment);Sitting;Standing;Walking  sleeping   How long can you stand comfortably? tries to limit standing to while at work   Diagnostic tests x-ray revealed degenerative changes   Patient Stated Goals Less pain   Currently in Pain? Yes   Pain Score 4   Least 0/10, Avg 4/10, Worst 6/10   Pain Location Back   Pain Orientation Left;Lower  "sacroiliac joint" (patient  phrasing)   Pain Descriptors / Indicators Aching;Sharp  Usually ache, but sharp after standing for a while   Pain Type Chronic pain   Pain Radiating Towards "occasionally all the way down sciatic nerve"   Pain Onset More than a month ago  10 years ago   Pain Frequency Constant   Aggravating Factors  standing, laying flat on back, walking, sitting   Pain Relieving Factors "pop" her back, changing position   Effect of Pain on Daily Activities "makes work bad", disrupts sleep            [facility] PT Assessment - 06/27/15 1452    Assessment   Onset Date/Surgical Date --  ~10 yrs ago   Next MD Visit not scheduled   Prior Therapy chiropracter   Balance Screen   Has the patient fallen in the past 6 months Yes   How many times? 1  tripped over a load stand at work, landing on knee   Has the patient had a decrease in activity level because of a fear of falling?  No   Is the patient reluctant to leave their home because of a fear of falling?  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 (involves twisting motions)   Leisure crochet, read   Observation/Other Assessments   Focus on Therapeutic Outcomes (FOTO)  54% (46% limited); predicted 66% (34% limited)   Posture/Postural Control   Posture/Postural Control Postural limitations   Posture Comments --  mild scoliosis, concave right   ROM / Strength   AROM / PROM / Strength AROM   AROM   AROM Assessment Site Lumbar   Lumbar Flexion WFL   Lumbar Extension 50% of normal  pain limited   Lumbar - Right Side [facility]   Lumbar - Left Side Bend [facility]   Lumbar - Right Rotation [facility]   Lumbar - Left Rotation [facility]   Flexibility   Hamstrings mild tightness bilaterally    Piriformis tight L > R   FABER test   findings Positive   Side LEft   Comment L > R   Straight Leg Raise   Findings  Positive   Side  Right                   [facility] Adult PT Treatment/Exercise - 06/27/15 1452    Lumbar Exercises: Stretches   Passive Hamstring Stretch 3 reps;20 seconds   Passive Hamstring Stretch Limitations with towel   Single Knee to Chest Stretch 3 reps;20 seconds   Piriformis Stretch 3 reps;20 seconds   Lumbar Exercises: Supine   Ab Set 10 reps;5 seconds   Lumbar Exercises: Quadruped   Madcat/Old Horse 10 reps                PT Education - 06/27/15 1549    Education provided Yes   Education Details Initial HEP, use of ice vs heat for pain relief   Person(s) Educated Patient   Methods Explanation;Demonstration;Handout   Comprehension Verbalized understanding;Returned demonstration;Need further instruction          PT Short Term Goals - 06/27/15 1602    PT SHORT TERM GOAL #1   Title Independent with initial HEP (07/25/15)   Time 4   Period Weeks   Status New           PT Long Term Goals - 06/27/15 1603    PT LONG TERM GOAL #1   Title Independent with advanced HEP (08/22/15)   Time 8   Period Weeks   Status New   PT LONG TERM GOAL #2   Title Lumbar ROM WFL with increased pain (08/22/15)   Time 8   Period Weeks   Status New   PT LONG TERM GOAL #3   Title 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 Pain in mid/left low back with occasional radiation down left leg along sciatic nerve pattern. Pain limits sleeping and work tolerance, along with prolonged positioning with frequent changes of position necessary. Mild scoliosis present and tigtness noted in piriformis  (L>R), bilateral hamstrings creating flat back posture with right rib humpt with forward flexion. Lumbar ROM WFL except extension limited to ~50% of normal.  Pt will benefit from skilled therapeutic intervention in order to improve on the following deficits Pain;Impaired flexibility;Decreased range of motion;Decreased strength;Postural dysfunction;Decreased activity tolerance;Difficulty walking   Rehab Potential Good   PT Frequency 2x / week   PT Duration 8 weeks   PT Treatment/Interventions Therapeutic exercise;Therapeutic activities;Functional mobility training;Neuromuscular re-education;Manual techniques;Taping;Ultrasound;Moist Heat;Cryotherapy;Electrical Stimulation;Iontophoresis 4mg /ml Dexamethasone   PT Next Visit Plan review of HEP, core flexibility and stabilzation training   Consulted and Agree with Plan of Care Patient         Problem List Patient Active Problem List   Diagnosis Date Noted  . 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 Back pain with left-sided sciatica 05/24/2015  . Adjustment disorder with mixed anxiety and depressed mood 05/24/2015  . Hemorrhoid 05/24/2015  . Right knee injury 02/17/2015  . Allergic rhinitis 08/12/2012  . BPPV (benign paroxysmal positional vertigo) 12/25/2011  . Amenorrhea 12/25/2011  . Ear pain 12/25/2011  . Depression, major, recurrent, mild 10/14/2011  . Dysphagia 09/25/2011  . Family planning advice 09/03/2011  . Bronchitis 05/12/2011    Marry GuanJoAnne M Elzy Tomasello, PT, MPT 06/27/2015, 4:10 PM  [facility] [place] [address]  [address] [place], [place], [address] Phone: [phone]   Fax:  [phone]

## 2015-07-02 NOTE — Telephone Encounter (Signed)
Opened for referral  

## 2015-07-04 ENCOUNTER — Ambulatory Visit: Payer: BLUE CROSS/BLUE SHIELD | Admitting: Physical Therapy

## 2015-07-04 DIAGNOSIS — M533 Sacrococcygeal disorders, not elsewhere classified: Secondary | ICD-10-CM

## 2015-07-04 DIAGNOSIS — M545 Low back pain: Secondary | ICD-10-CM

## 2015-07-04 NOTE — Therapy (Signed)
[facility] [address]  [address] [place], [place], [address] Phone: [phone]   Fax:  [phone]  Physical Therapy Treatment  Patient Details  Name: Carolyn Rasmussen MRN: [id number] Date of Birth: July 04, 1981 Referring Provider:  Esperanza Richters, PA-C  Encounter Date: 07/04/2015      PT End of Session - 07/04/15 1622    Visit Number 2   Number of Visits 16   PT Start Time 1608   PT Stop Time 1658   PT Time Calculation (min) 50 min   Activity Tolerance Patient tolerated treatment well   Behavior During Therapy [facility] for tasks assessed/performed      Past Medical History  Diagnosis Date  . Depression   . Allergy   . GERD (gastroesophageal reflux disease)   . Chronic kidney disease 2002    kidney failure secondary to dehy./ibuprof  . Anxiety   . Genital herpes     Past Surgical History  Procedure Laterality Date  . Tubes in ears      x4  . Typanoplasty    . Kidney stone surgery      There were no vitals filed for this visit.  Visit Diagnosis:  Left low back pain, with sciatica presence unspecified  Sacroiliac joint dysfunction of left side      Subjective Assessment - 07/04/15 1612    Subjective Patient states back pain is about the same. Reports completing HEP 2x/day plus she added resisted hip abduction with "medium" (? green) resistance band on her own.   Currently in Pain? Yes   Pain Score 6    Pain Location Back   Pain Orientation Left;Lower   Pain Descriptors / Indicators Throbbing;Shooting   Multiple Pain Sites No                  [facility] Adult PT Treatment/Exercise - 07/04/15 1618    Exercises   Exercises Lumbar   Lumbar Exercises: Stretches   Passive Hamstring Stretch 3 reps;20 seconds   Passive Hamstring Stretch Limitations with strap   Single Knee to Chest Stretch 3 reps;20 seconds   Prone on Elbows Stretch 60 seconds;2 reps   Quadruped Mid Back Stretch 30 seconds;2 reps    Quadruped Mid Back Stretch Limitations Prayer stretch L/C/R   Piriformis Stretch 3 reps;20 seconds   Lumbar Exercises: Aerobic   UBE (Upper Arm Bike) lvl 1.5 - 2' fwd/back   Lumbar Exercises: Supine   Ab Set 10 reps;5 seconds   Clam 15 reps;3 seconds   Clam Limitations Green TB   Bent Knee Raise 15 reps   Bent Knee Raise Limitations TrA   Bridge 3 seconds;10 reps  2   Bridge Limitations 2 sets - 2nd set with ball btw knees   Lumbar Exercises: Quadruped   Madcat/Old Horse 10 reps   Modalities   Modalities Cryotherapy   Cryotherapy   Number Minutes Cryotherapy 10 Minutes   Cryotherapy Location Lumbar Spine   Type of Cryotherapy Ice pack   Manual Therapy   Manual Therapy Muscle Energy Technique   Muscle Energy Technique Posterior rotation of innominate bone & Lateral Glide of ASIS to gap anterior SI joint                PT Education - 07/04/15 1704    Education provided Yes   Education Details Explanation of SI dysfunction; modification of HEP to hold unilateral stretches with shift of focus to symmetrical activities and core stabilzation with  addition of bridging, POE and prayer stretches   Person(s) Educated Patient   Methods Explanation;Demonstration   Comprehension Verbalized understanding;Returned demonstration          PT Short Term Goals - 06/27/15 1602    PT SHORT TERM GOAL #1   Title Independent with initial HEP (07/25/15)   Time 4   Period Weeks   Status New           PT Long Term Goals - 06/27/15 1603    PT LONG TERM GOAL #1   Title Independent with advanced HEP (08/22/15)   Time 8   Period Weeks   Status New   PT LONG TERM GOAL #2   Title Lumbar ROM WFL with increased pain (08/22/15)   Time 8   Period Weeks   Status New   PT LONG TERM GOAL #3   Title 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 Assessment revealed apparent anterior rotation of the innominate bone at the SI joint. Muscle energy techniques performed to correct rotation followed by core stabilzation exercises and ice pack with patient reporting complete resolution of pan by end of treatment.   PT Next Visit Plan reassessment of SI joint alignment, core flexibility and stabilzation training, modalities PRN        Problem List Patient Active Problem List   Diagnosis Date Noted  . 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 Back pain with left-sided sciatica 05/24/2015  . Adjustment disorder with mixed anxiety and depressed mood 05/24/2015  . Hemorrhoid 05/24/2015  . Right knee injury 02/17/2015  . Allergic rhinitis 08/12/2012  . BPPV (benign paroxysmal positional vertigo) 12/25/2011  . Amenorrhea 12/25/2011  . Ear pain 12/25/2011  . Depression, major, recurrent, mild 10/14/2011  . Dysphagia 09/25/2011  . Family planning advice 09/03/2011  . Bronchitis 05/12/2011    Marry Guan, PT, MPT 07/04/2015, 5:18 PM  [facility] [address]  [address] [place], [place], [address] Phone: [phone]   Fax:  [phone]

## 2015-07-05 ENCOUNTER — Other Ambulatory Visit: Payer: Self-pay | Admitting: Medical

## 2015-07-06 ENCOUNTER — Ambulatory Visit: Payer: BLUE CROSS/BLUE SHIELD | Admitting: Physical Therapy

## 2015-07-06 DIAGNOSIS — M533 Sacrococcygeal disorders, not elsewhere classified: Secondary | ICD-10-CM

## 2015-07-06 DIAGNOSIS — M545 Low back pain: Secondary | ICD-10-CM

## 2015-07-06 MED ORDER — DULOXETINE HCL 60 MG PO CPEP: 60.0000 mg | ORAL_CAPSULE | Freq: Every day | ORAL | Status: DC

## 2015-07-06 NOTE — Therapy (Signed)
[facility] [place] [address]  [place], [place], [address] Phone: [phone]   Fax:  [phone]  Physical Therapy Treatment  Patient Details  Name: Carolyn Rasmussen MRN: [id number] Date of Birth: 01-18-1981 Referring Provider:  Mackie Pai, PA-C  Encounter Date: 07/06/2015      PT End of Session - 07/06/15 1751    Visit Number 3   Number of Visits 16   Date for PT Re-Evaluation 08/22/15   PT Start Time 1703   PT Stop Time 1758   PT Time Calculation (min) 55 min   Activity Tolerance Patient tolerated treatment well   Behavior During Therapy [facility] for tasks assessed/performed      Past Medical History  Diagnosis Date  . Depression   . Allergy   . GERD (gastroesophageal reflux disease)   . Chronic kidney disease 2002    kidney failure secondary to dehy./ibuprof  . Anxiety   . Genital herpes     Past Surgical History  Procedure Laterality Date  . Tubes in ears      x4  . Typanoplasty    . Kidney stone surgery      There were no vitals filed for this visit.  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 Pain after work shift last night so bad she had diffculty getting out of the car.   Currently in Pain? Yes   Pain Score 5   8/10 after work shift last night   Pain Location Back   Pain Orientation Left   Pain Descriptors / Indicators Throbbing                 [facility] Adult PT Treatment/Exercise - 07/06/15 1716    Exercises   Exercises Lumbar   Lumbar Exercises: Stretches   Prone on Elbows Stretch 2 reps   Prone on Elbows Stretch Limitations 2 min   Press  Ups 20 seconds;2 reps   Quadruped Mid Back Stretch 30 seconds;2 reps   Quadruped Mid Back Stretch Limitations Prayer stretch L/C/R   Lumbar Exercises: Aerobic   Stationary Bike lvl 1 x 5 min   Lumbar Exercises: Supine   Ab Set 10 reps;5 seconds   Clam 10 reps;3 seconds  2 sets   Clam Limitations Green TB   Bridge 3 seconds;10 reps  2   Bridge Limitations 2 sets with ball btw knees   Lumbar Exercises: Quadruped   Madcat/Old Horse 15 reps   Modalities   Modalities Electrical Stimulation;Cryotherapy   Cryotherapy   Number Minutes Cryotherapy 15 Minutes   Cryotherapy Location Lumbar Spine   Type of Cryotherapy Ice pack   Electrical Stimulation   Electrical Stimulation Location Left SI joint   Electrical Stimulation Action IFC (80-150)   Electrical Stimulation Parameters to patient tolerance (Intensity 13) x 15 min   Electrical Stimulation Goals Pain   Manual Therapy   Manual Therapy Muscle Energy Technique;Joint mobilization   Joint Mobilization Left SI joint A/P mobs - grade I-II   Muscle Energy Technique Posterior rotation of innominate bone & Lateral Glide of ASIS to gap anterior SI joint  PT Education - 07/06/15 1750    Education Details Good lifting mechanics, symmetrical posture, using full body pivot rather than twisting, no crossing legs          PT Short Term Goals - 07/06/15 1801    PT SHORT TERM GOAL #1   Title Independent with initial HEP (07/25/15)   Status On-going           PT Long Term Goals - 07/06/15 1801    PT LONG TERM GOAL #1   Title Independent with advanced HEP (08/22/15)   Status On-going   PT LONG TERM GOAL #2   Status On-going   PT LONG TERM GOAL #3   Title 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 MET performed again today along with joint mobs to left SI joint with partial relief of pain. Provided education in postural awareness and good body mechanics to minimize risk of recurrence.    PT Next Visit Plan reassessment of SI joint alignment, core flexibility and stabilzation training, modalities PRN   Consulted and Agree with Plan of Care Patient        Problem List Patient Active Problem List   Diagnosis Date Noted  . 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 Back pain with left-sided sciatica 05/24/2015  . Adjustment disorder with mixed anxiety and depressed mood 05/24/2015  . Hemorrhoid 05/24/2015  . Right knee injury 02/17/2015  . Allergic rhinitis 08/12/2012  . BPPV (benign paroxysmal positional vertigo) 12/25/2011  . Amenorrhea 12/25/2011  . Ear pain 12/25/2011  . Depression, major, recurrent, mild 10/14/2011  . Dysphagia 09/25/2011  . Family planning advice 09/03/2011  . Bronchitis 05/12/2011    Percival Spanish, PT, MPT 07/06/2015, 6:04 PM  [facility] [address]  [place], [place], [address] Phone: [phone]   Fax:  [phone]

## 2015-07-18 ENCOUNTER — Ambulatory Visit: Payer: BLUE CROSS/BLUE SHIELD | Attending: Medical | Admitting: Physical Therapy

## 2015-07-18 DIAGNOSIS — M533 Sacrococcygeal disorders, not elsewhere classified: Secondary | ICD-10-CM | POA: Diagnosis present

## 2015-07-18 DIAGNOSIS — M545 Low back pain: Secondary | ICD-10-CM | POA: Diagnosis present

## 2015-07-18 NOTE — Therapy (Signed)
[facility] [address]  [address] [place], [place], [address] Phone: [phone]   Fax:  [phone]  Physical Therapy Treatment  Patient Details  Name: Carolyn Rasmussen MRN: [id number] Date of Birth: 28-Sep-1981 Referring Provider:  Esperanza Richters, PA-C  Encounter Date: 07/18/2015      PT End of Session - 07/18/15 1521    Visit Number 4   Number of Visits 16   Date for PT Re-Evaluation 08/22/15   PT Start Time 1450   PT Stop Time 1520   PT Time Calculation (min) 30 min   Activity Tolerance Patient tolerated treatment well   Behavior During Therapy [facility] for tasks assessed/performed      Past Medical History  Diagnosis Date  . Depression   . Allergy   . GERD (gastroesophageal reflux disease)   . Chronic kidney disease 2002    kidney failure secondary to dehy./ibuprof  . Anxiety   . Genital herpes     Past Surgical History  Procedure Laterality Date  . Tubes in ears      x4  . Typanoplasty    . Kidney stone surgery      There were no vitals filed for this visit.  Visit Diagnosis:  Left low back pain, with sciatica presence unspecified  Sacroiliac joint dysfunction of left side      Subjective Assessment - 07/18/15 1456    Subjective Patient was on vacation last week. Reports minimal to no pain in back, only some discomfort with prolonged sitting in car when unable to do her stretches. Able to walk long distances without back pain. Returned to work last night without pain, just fatigue.   Currently in Pain? No/denies                  [facility] Adult PT Treatment/Exercise - 07/18/15 1455    Exercises   Exercises Lumbar   Lumbar Exercises: Stretches   Passive Hamstring Stretch 30 seconds;2 reps   Passive Hamstring Stretch Limitations manual   Prone on Elbows Stretch 2 reps   Prone on Elbows Stretch Limitations 2 min   Press Ups 30 seconds;3 reps   Quadruped Mid Back Stretch 30 seconds;3 reps    Quadruped Mid Back Stretch Limitations Prayer stretch L/C/R   Piriformis Stretch 30 seconds;2 reps   Piriformis Stretch Limitations manual   Lumbar Exercises: Standing   Functional Squats 10 reps;3 seconds   Functional Squats Limitations TRX; correction of neutral spine posture   Wall Slides 10 reps;3 seconds   Lumbar Exercises: Supine   Clam 10 reps;3 seconds  2 sets   Clam Limitations Green TB   Dead Bug 10 reps;3 seconds   Dead Bug Limitations TrA   Bridge 5 seconds;10 reps   Bridge Limitations 2 sets with ball btw knees   Other Supine Lumbar Exercises FOB Bridge x10 with 5" hold   Other Supine Lumbar Exercises Peanut ball tuck with TrA  x10   Lumbar Exercises: Quadruped   Madcat/Old Horse 15 reps                  PT Short Term Goals - 07/18/15 1528    PT SHORT TERM GOAL #1   Title Independent with initial HEP (07/25/15)   Status Achieved           PT Long Term Goals - 07/18/15 1528    PT LONG TERM GOAL #1   Title Independent with advanced HEP (08/22/15)  Status On-going   PT LONG TERM GOAL #2   Title Lumbar ROM WFL with increased pain (08/22/15)   Status On-going   PT LONG TERM GOAL #3   Title 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 Progressed lumbar stablization exercises with focus on slower movements, awarness of abdominal muscle activation, and good body mechanics to promote improved joint stability. Patient tolerated progression of exercises/activities without complaints.   PT Next Visit Plan reassessment of SI joint alignment PRN, core flexibility and stabilzation  training, modalities PRN   Consulted and Agree with Plan of Care Patient        Problem List Patient Active Problem List   Diagnosis Date Noted  . 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 Back pain with left-sided sciatica 05/24/2015  . Adjustment disorder with mixed anxiety and depressed mood 05/24/2015  . Hemorrhoid 05/24/2015  . Right knee injury 02/17/2015  . Allergic rhinitis 08/12/2012  . BPPV (benign paroxysmal positional vertigo) 12/25/2011  . Amenorrhea 12/25/2011  . Ear pain 12/25/2011  . Depression, major, recurrent, mild 10/14/2011  . Dysphagia 09/25/2011  . Family planning advice 09/03/2011  . Bronchitis 05/12/2011    Marry Guan, PT, MPT 07/18/2015, 3:29 PM  [facility] [address]  [address] [place], [place], [address] Phone: [phone]   Fax:  [phone]

## 2015-07-20 ENCOUNTER — Ambulatory Visit: Payer: BLUE CROSS/BLUE SHIELD | Admitting: Physical Therapy

## 2015-07-20 DIAGNOSIS — M533 Sacrococcygeal disorders, not elsewhere classified: Secondary | ICD-10-CM

## 2015-07-20 DIAGNOSIS — M545 Low back pain: Secondary | ICD-10-CM | POA: Diagnosis not present

## 2015-07-20 NOTE — Therapy (Addendum)
[facility] [address]  [place], [place], [address] Phone: [phone]   Fax:  [phone]  Physical Therapy Treatment  Patient Details  Name: Carolyn Rasmussen MRN: [id number] Date of Birth: 02-08-1981 Referring Provider:  Mackie Pai, PA-C  Encounter Date: 07/20/2015      PT End of Session - 07/20/15 1749    Visit Number 5   Number of Visits 16   Date for PT Re-Evaluation 08/22/15   PT Start Time 2297   PT Stop Time 9892   PT Time Calculation (min) 59 min   Activity Tolerance Patient tolerated treatment well   Behavior During Therapy [facility] for tasks assessed/performed      Past Medical History  Diagnosis Date  . Depression   . Allergy   . GERD (gastroesophageal reflux disease)   . Chronic kidney disease 2002    kidney failure secondary to dehy./ibuprof  . Anxiety   . Genital herpes     Past Surgical History  Procedure Laterality Date  . Tubes in ears      x4  . Typanoplasty    . Kidney stone surgery      There were no vitals filed for this visit.  Visit Diagnosis:  Left low back pain, with sciatica presence unspecified  Sacroiliac joint dysfunction of left side      Subjective Assessment - 07/20/15 1619    Subjective Patient states back is more sore today after being back at work for a few days since vacation. States could feel deep hip/buttock muscles tightening up while at work. Reports attempting to be more conscious of posture and body mechanics while working.   Currently in Pain? 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 to decreased low back strain, LE pivot to avoid rotation in back   Exercises   Exercises Lumbar   Lumbar Exercises: Stretches   Prone on Elbows Stretch 2 reps   Prone on Elbows Stretch Limitations 2 min   Press Ups 30 seconds;3 reps   Piriformis Stretch 30 seconds;2 reps   Piriformis Stretch Limitations manual   Lumbar Exercises: Supine   Clam 10 reps;3 seconds  2 sets   Clam Limitations Blue TB   Bridge 5 seconds;10 reps   Bridge Limitations 2 sets with ball btw knees   Other Supine Lumbar Exercises FOB (orange-55cm) Bridge x10 with 5" hold   Other Supine Lumbar Exercises FOB (orange-55cm) tuck with TrA  x10   Lumbar Exercises: Prone   Single Arm Raise Right;Left;10 reps;3 seconds   Straight Leg Raise 10 reps;3 seconds   Opposite Arm/Leg Raise Right arm/Left leg;Left arm/Right leg;10 reps;3 seconds   Modalities   Modalities Electrical Stimulation;Cryotherapy   Cryotherapy   Number Minutes Cryotherapy 15 Minutes   Cryotherapy Location Lumbar Spine   Type of Cryotherapy Ice pack   Electrical Stimulation   Electrical Stimulation Location Left SI joint   Electrical Stimulation Action IFC   Electrical Stimulation Parameters 80-150 Hz, intensity to patient  tolerance (20) x 15 min   Electrical Stimulation Goals Pain   Manual Therapy   Manual Therapy Soft tissue mobilization;Muscle Energy Technique   Joint Mobilization Left SI joint A/P mobs - grade I-III, Posterior glide left hip with hip and knee flexed to 90 dg in neutral and slight abduction   Soft tissue mobilization DTM to piriformis   Muscle Energy Technique Posterior rotation of innominate bone & Lateral Glide of ASIS to gap anterior SI joint                  PT Short Term Goals - 07/18/15 1528    PT SHORT TERM GOAL #1   Title Independent with initial HEP (07/25/15)   Status Achieved           PT Long Term Goals - 07/18/15 1528    PT LONG TERM GOAL #1   Title Independent with  advanced HEP (08/22/15)   Status On-going   PT LONG TERM GOAL #2   Title Lumbar ROM WFL without increased pain (08/22/15)   Status On-going   PT LONG TERM GOAL #3   Title Patient will demonstrate good body mechanics with package handling to reduce back strain at work (08/22/15)   Status On-going   PT LONG TERM GOAL #4   Title Patient able to sleep without restriction from low back pain (08/22/15)   Status On-going               Plan - 07/20/15 1751    Clinical Impression Statement Minimal to no rotation noted at [facility] joint today but increased hypomobility with adjacent muscle tightness, therefore therapy focused on joint and soft tissue mobilization to improve mobility and reduce pain and core strengthening to increased stability. Incorporated work simulation with suggestions to reduce low back strain and rotational forces on spine and SI joint.   PT Next Visit Plan reassessment of SI joint alignment PRN, core flexibility and stabilzation training, modalities PRN   Consulted and Agree with Plan of Care Patient        Problem List Patient Active Problem List   Diagnosis Date Noted  . 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 Back pain with left-sided sciatica 05/24/2015  . Adjustment disorder with mixed anxiety and depressed mood 05/24/2015  . Hemorrhoid 05/24/2015  . Right knee injury 02/17/2015  . Allergic rhinitis 08/12/2012  . BPPV (benign paroxysmal positional vertigo) 12/25/2011  . Amenorrhea 12/25/2011  . Ear pain 12/25/2011  . Depression, major, recurrent, mild 10/14/2011  . Dysphagia 09/25/2011  . Family planning advice 09/03/2011  . Bronchitis 05/12/2011    Percival Spanish, PT, MPT  07/20/2015, 6:01 PM  [facility] [address]  [place], [place], [address] Phone: [phone]   Fax:  [phone]     PHYSICAL THERAPY DISCHARGE SUMMARY  Visits from Start of Care: 5  Current functional level  related to goals / functional outcomes:  HEP provided along with training in good body mechanics/work simulation, but unable to assess compliance and carryover as patient failed to return to therapy.    Remaining deficits:  As of last visit, reporting 4/10 pain in low back/sacroiliac joint. Otherwise unable to assess secondary to failure to return to therapy.   Education / Equipment:  HEP Plan: Patient agrees to discharge.  Patient goals were not met. Patient is being discharged due to not returning since the last visit.  ?????       Zaniel Marineau  M. Luis Abed, PT, MPT 08/25/2015, 8:13 AM  [facility] [address]  [place], [place], [address] Phone: [phone]   Fax:  [phone]

## 2015-09-18 ENCOUNTER — Other Ambulatory Visit: Payer: Self-pay

## 2015-09-18 ENCOUNTER — Telehealth: Payer: Self-pay

## 2015-09-18 MED ORDER — DULOXETINE HCL 60 MG PO CPEP: 60.0000 mg | ORAL_CAPSULE | Freq: Every day | ORAL | Status: DC

## 2015-09-18 NOTE — Telephone Encounter (Signed)
Error

## 2015-09-18 NOTE — Telephone Encounter (Signed)
Pt was last seen 05/24/15.  Please advise on refill.

## 2015-09-18 NOTE — Telephone Encounter (Signed)
Pt was seen by me in the summer. I wanted her to follow up in a month. She did not follow up. I have only seen her one time. How is she doing with mood and anxiety. I will give her  Rx 30 tabs cymbalta. She needs to come in for official check up before these tabs run out. If she states not doing well then go ahead and schedule appointment this week.

## 2015-09-18 NOTE — Telephone Encounter (Signed)
Left message for pt to call back  °

## 2015-09-18 NOTE — Telephone Encounter (Signed)
Spoke with pt and she voices understanding. Pt will call back to set up an appointment.

## 2015-10-23 ENCOUNTER — Encounter (HOSPITAL_BASED_OUTPATIENT_CLINIC_OR_DEPARTMENT_OTHER): Payer: Self-pay

## 2015-10-23 ENCOUNTER — Emergency Department (HOSPITAL_BASED_OUTPATIENT_CLINIC_OR_DEPARTMENT_OTHER): Payer: 59

## 2015-10-23 ENCOUNTER — Emergency Department (HOSPITAL_BASED_OUTPATIENT_CLINIC_OR_DEPARTMENT_OTHER)
Admission: EM | Admit: 2015-10-23 | Discharge: 2015-10-23 | Disposition: A | Discharge status: Home / Self Care | Payer: 59 | Attending: Emergency Medicine | Admitting: Emergency Medicine

## 2015-10-23 DIAGNOSIS — J019 Acute sinusitis, unspecified: Secondary | ICD-10-CM | POA: Diagnosis not present

## 2015-10-23 DIAGNOSIS — H938X3 Other specified disorders of ear, bilateral: Secondary | ICD-10-CM | POA: Insufficient documentation

## 2015-10-23 DIAGNOSIS — Z791 Long term (current) use of non-steroidal anti-inflammatories (NSAID): Secondary | ICD-10-CM | POA: Diagnosis not present

## 2015-10-23 DIAGNOSIS — R000 Tachycardia, unspecified: Secondary | ICD-10-CM | POA: Diagnosis not present

## 2015-10-23 DIAGNOSIS — N189 Chronic kidney disease, unspecified: Secondary | ICD-10-CM | POA: Insufficient documentation

## 2015-10-23 DIAGNOSIS — Z79899 Other long term (current) drug therapy: Secondary | ICD-10-CM | POA: Diagnosis not present

## 2015-10-23 DIAGNOSIS — Z87891 Personal history of nicotine dependence: Secondary | ICD-10-CM | POA: Diagnosis not present

## 2015-10-23 DIAGNOSIS — J069 Acute upper respiratory infection, unspecified: Secondary | ICD-10-CM | POA: Insufficient documentation

## 2015-10-23 DIAGNOSIS — H9209 Otalgia, unspecified ear: Secondary | ICD-10-CM | POA: Diagnosis not present

## 2015-10-23 DIAGNOSIS — F419 Anxiety disorder, unspecified: Secondary | ICD-10-CM | POA: Diagnosis not present

## 2015-10-23 DIAGNOSIS — R062 Wheezing: Secondary | ICD-10-CM | POA: Diagnosis present

## 2015-10-23 DIAGNOSIS — Z8619 Personal history of other infectious and parasitic diseases: Secondary | ICD-10-CM | POA: Insufficient documentation

## 2015-10-23 DIAGNOSIS — F329 Major depressive disorder, single episode, unspecified: Secondary | ICD-10-CM | POA: Insufficient documentation

## 2015-10-23 DIAGNOSIS — Z7952 Long term (current) use of systemic steroids: Secondary | ICD-10-CM | POA: Diagnosis not present

## 2015-10-23 DIAGNOSIS — M791 Myalgia: Secondary | ICD-10-CM | POA: Insufficient documentation

## 2015-10-23 DIAGNOSIS — Z8719 Personal history of other diseases of the digestive system: Secondary | ICD-10-CM | POA: Diagnosis not present

## 2015-10-23 MED ORDER — IPRATROPIUM-ALBUTEROL 0.5-2.5 (3) MG/3ML IN SOLN: 3.0000 mL | Freq: Four times a day (QID) | RESPIRATORY_TRACT | Status: DC

## 2015-10-23 MED ORDER — DEXAMETHASONE 4 MG PO TABS: 10.0000 mg | ORAL_TABLET | Freq: Once | ORAL | Status: DC

## 2015-10-23 MED ORDER — ALBUTEROL SULFATE (2.5 MG/3ML) 0.083% IN NEBU
5.0000 mg | INHALATION_SOLUTION | Freq: Once | RESPIRATORY_TRACT | Status: AC
  Administered 2015-10-23: 5 mg via RESPIRATORY_TRACT
  Filled 2015-10-23: qty 6

## 2015-10-23 MED ORDER — IPRATROPIUM-ALBUTEROL 0.5-2.5 (3) MG/3ML IN SOLN
RESPIRATORY_TRACT | Status: AC
  Administered 2015-10-23: 3 mL
  Filled 2015-10-23: qty 3

## 2015-10-23 MED ORDER — AMOXICILLIN-POT CLAVULANATE 875-125 MG PO TABS: 1.0000 | ORAL_TABLET | Freq: Two times a day (BID) | ORAL | Status: DC

## 2015-10-23 MED ORDER — DEXAMETHASONE 4 MG PO TABS
10.0000 mg | ORAL_TABLET | Freq: Once | ORAL | Status: AC
  Administered 2015-10-23: 10 mg via ORAL

## 2015-10-23 MED ORDER — ALBUTEROL SULFATE HFA 108 (90 BASE) MCG/ACT IN AERS
2.0000 | INHALATION_SPRAY | RESPIRATORY_TRACT | Status: DC | PRN
  Administered 2015-10-23: 2 via RESPIRATORY_TRACT
  Filled 2015-10-23: qty 6.7

## 2015-10-23 MED ORDER — IPRATROPIUM BROMIDE 0.02 % IN SOLN
0.5000 mg | Freq: Once | RESPIRATORY_TRACT | Status: AC
  Administered 2015-10-23: 0.5 mg via RESPIRATORY_TRACT
  Filled 2015-10-23: qty 2.5

## 2015-10-23 MED ORDER — ALBUTEROL SULFATE (2.5 MG/3ML) 0.083% IN NEBU
INHALATION_SOLUTION | RESPIRATORY_TRACT | Status: AC
  Administered 2015-10-23: 2.5 mg
  Filled 2015-10-23: qty 3

## 2015-10-23 MED ORDER — DEXAMETHASONE 4 MG PO TABS
ORAL_TABLET | ORAL | Status: AC
  Filled 2015-10-23: qty 3

## 2015-10-23 NOTE — ED Notes (Signed)
Pt hyperventilating at nurse first.  No acute respiratory distress noted.  Pulse ox 100% on room air.  Color pink.  Pt coughing intermittently.

## 2015-10-23 NOTE — ED Notes (Signed)
Reports she has been sick for approx 1 month.  Reports worse last night.  Pt has congested cough and audible upper airway wheezing.

## 2015-10-23 NOTE — ED Notes (Signed)
Pt states has had cough x 1 month, cough became worse last PM, pt very dyspneic upon return from radiology, tachypneic and tachycardic

## 2015-10-23 NOTE — Discharge Instructions (Signed)
You were seen tonight for your cough, wheezing, sinus symptoms.  Take the antibiotics as prescribed.  Use the inhaler every 4 hours as needed.  Take the second dose of steroids on Wednesday morning as a single dose.  Keep yourself well hydrated.  Drink as much fluids as you can.  If you feel you are becoming more dehydrated return for evaluation.  Get plenty of rest.   Sinusitis, Adult Sinusitis is redness, soreness, and inflammation of the paranasal sinuses. Paranasal sinuses are air pockets within the bones of your face. They are located beneath your eyes, in the middle of your forehead, and above your eyes. In healthy paranasal sinuses, mucus is able to drain out, and air is able to circulate through them by way of your nose. However, when your paranasal sinuses are inflamed, mucus and air can become trapped. This can allow bacteria and other germs to grow and cause infection. Sinusitis can develop quickly and last only a short time (acute) or continue over a long period (chronic). Sinusitis that lasts for more than 12 weeks is considered chronic. CAUSES Causes of sinusitis include:  Allergies.  Structural abnormalities, such as displacement of the cartilage that separates your nostrils (deviated septum), which can decrease the air flow through your nose and sinuses and affect sinus drainage.  Functional abnormalities, such as when the small hairs (cilia) that line your sinuses and help remove mucus do not work properly or are not present. SIGNS AND SYMPTOMS Symptoms of acute and chronic sinusitis are the same. The primary symptoms are pain and pressure around the affected sinuses. Other symptoms include:  Upper toothache.  Earache.  Headache.  Bad breath.  Decreased sense of smell and taste.  A cough, which worsens when you are lying flat.  Fatigue.  Fever.  Thick drainage from your nose, which often is green and may contain pus (purulent).  Swelling and warmth over the  affected sinuses. DIAGNOSIS Your health care provider will perform a physical exam. During your exam, your health care provider may perform any of the following to help determine if you have acute sinusitis or chronic sinusitis:  Look in your nose for signs of abnormal growths in your nostrils (nasal polyps).  Tap over the affected sinus to check for signs of infection.  View the inside of your sinuses using an imaging device that has a light attached (endoscope). If your health care provider suspects that you have chronic sinusitis, one or more of the following tests may be recommended:  Allergy tests.  Nasal culture. A sample of mucus is taken from your nose, sent to a lab, and screened for bacteria.  Nasal cytology. A sample of mucus is taken from your nose and examined by your health care provider to determine if your sinusitis is related to an allergy. TREATMENT Most cases of acute sinusitis are related to a viral infection and will resolve on their own within 10 days. Sometimes, medicines are prescribed to help relieve symptoms of both acute and chronic sinusitis. These may include pain medicines, decongestants, nasal steroid sprays, or saline sprays. However, for sinusitis related to a bacterial infection, your health care provider will prescribe antibiotic medicines. These are medicines that will help kill the bacteria causing the infection. Rarely, sinusitis is caused by a fungal infection. In these cases, your health care provider will prescribe antifungal medicine. For some cases of chronic sinusitis, surgery is needed. Generally, these are cases in which sinusitis recurs more than 3 times per year, despite  other treatments. HOME CARE INSTRUCTIONS  Drink plenty of water. Water helps thin the mucus so your sinuses can drain more easily.  Use a humidifier.  Inhale steam 3-4 times a day (for example, sit in the bathroom with the shower running).  Apply a warm, moist washcloth to  your face 3-4 times a day, or as directed by your health care provider.  Use saline nasal sprays to help moisten and clean your sinuses.  Take medicines only as directed by your health care provider.  If you were prescribed either an antibiotic or antifungal medicine, finish it all even if you start to feel better. SEEK IMMEDIATE MEDICAL CARE IF:  You have increasing pain or severe headaches.  You have nausea, vomiting, or drowsiness.  You have swelling around your face.  You have vision problems.  You have a stiff neck.  You have difficulty breathing.   This information is not intended to replace advice given to you by your health care provider. Make sure you discuss any questions you have with your health care provider.   Document Released: 11/25/2005 Document Revised: 12/16/2014 Document Reviewed: 12/10/2011 Elsevier Interactive Patient Education 2016 Else[employer] Inc[employer]   Acute Bronchitis Bronchitis is inflammation of the airways that extend from the windpipe into the lungs (bronchi). The inflammation often causes mucus to develop. This leads to a cough, which is the most common symptom of bronchitis.  In acute bronchitis, the condition usually develops suddenly and goes away over time, usually in a couple weeks. Smoking, allergies, and asthma can make bronchitis worse. Repeated episodes of bronchitis may cause further lung problems.  CAUSES Acute bronchitis is most often caused by the same virus that causes a cold. The virus can spread from person to person (contagious) through coughing, sneezing, and touching contaminated objects. SIGNS AND SYMPTOMS   Cough.   Fever.   Coughing up mucus.   Body aches.   Chest congestion.   Chills.   Shortness of breath.   Sore throat.  DIAGNOSIS  Acute bronchitis is usually diagnosed through a physical exam. Your health care provider will also ask you questions about your medical history. Tests, such as chest X-rays, are  sometimes done to rule out other conditions.  TREATMENT  Acute bronchitis usually goes away in a couple weeks. Oftentimes, no medical treatment is necessary. Medicines are sometimes given for relief of fever or cough. Antibiotic medicines are usually not needed but may be prescribed in certain situations. In some cases, an inhaler may be recommended to help reduce shortness of breath and control the cough. A cool mist vaporizer may also be used to help thin bronchial secretions and make it easier to clear the chest.  HOME CARE INSTRUCTIONS  Get plenty of rest.   Drink enough fluids to keep your urine clear or pale yellow (unless you have a medical condition that requires fluid restriction). Increasing fluids may help thin your respiratory secretions (sputum) and reduce chest congestion, and it will prevent dehydration.   Take medicines only as directed by your health care provider.  If you were prescribed an antibiotic medicine, finish it all even if you start to feel better.  Avoid smoking and secondhand smoke. Exposure to cigarette smoke or irritating chemicals will make bronchitis worse. If you are a smoker, consider using nicotine gum or skin patches to help control withdrawal symptoms. Quitting smoking will help your lungs heal faster.   Reduce the chances of another bout of acute bronchitis by washing your hands frequently, avoiding  people with cold symptoms, and trying not to touch your hands to your mouth, nose, or eyes.   Keep all follow-up visits as directed by your health care provider.  SEEK MEDICAL CARE IF: Your symptoms do not improve after 1 week of treatment.  SEEK IMMEDIATE MEDICAL CARE IF:  You develop an increased fever or chills.   You have chest pain.   You have severe shortness of breath.  You have bloody sputum.   You develop dehydration.  You faint or repeatedly feel like you are going to pass out.  You develop repeated vomiting.  You develop a  severe headache. MAKE SURE YOU:   Understand these instructions.  Will watch your condition.  Will get help right away if you are not doing well or get worse.   This information is not intended to replace advice given to you by your health care provider. Make sure you discuss any questions you have with your health care provider.   Document Released: 01/02/2005 Document Revised: 12/16/2014 Document Reviewed: 05/18/2013 Elsevier Interactive Patient Education [employer]

## 2015-10-23 NOTE — ED Notes (Signed)
[facility] orders

## 2015-10-23 NOTE — ED Notes (Signed)
Patient extremely diaphoretic

## 2015-10-23 NOTE — ED Notes (Signed)
Placed on cont POX  With int NBP monitoring

## 2015-10-23 NOTE — ED Notes (Signed)
Position for comfort, HOB in high fowlers

## 2015-10-23 NOTE — ED Provider Notes (Signed)
CSN: [id number]     Arrival date & time 10/23/15  1842 History  By signing my name below, I, Budd Palmer, attest that this documentation has been prepared under the direction and in the presence of Leta Baptist, MD. Electronically Signed: Budd Palmer, ED Scribe. 10/23/2015. 7:34 PM.    Chief Complaint  Patient presents with  . Wheezing   The history is provided by the patient. No language interpreter was used.   HPI Comments: Carolyn Rasmussen is a 34 y.o. female former smoker who presents to the Emergency Department complaining of wheezing onset 1 day ago. She reports associated productive cough, inability to lie supine due to difficulty breathing in that position, rapid breathing, and congestion. She also states her ears feel "squishy." Pt states she usually gets bronchitis each fall, lasting for about a month. She notes that this cold has been going on for the past 2 months, worsening over the past week. Per mom, it was at it's worst 2 months ago, and then improved until last week. Pt states she has been taking cold medication and benadryl with moderate relief. She notes a PMHx of illness-induced asthma. She states she does not have an inhaler at home. She notes she feels a little better after having received a breathing treatment in the ED. Pt denies fever.  Past Medical History  Diagnosis Date  . Depression   . Allergy   . GERD (gastroesophageal reflux disease)   . Chronic kidney disease 2002    kidney failure secondary to dehy./ibuprof  . Anxiety   . Genital herpes    Past Surgical History  Procedure Laterality Date  . Tubes in ears      x4  . Typanoplasty    . Kidney stone surgery     Family History  Problem Relation Age of Onset  . Hypertension Mother   . Hyperlipidemia Father   . Heart disease Father   . Anxiety disorder Maternal Grandfather   . Anxiety disorder Maternal Grandmother   . Anxiety disorder Paternal Grandfather   . Anxiety disorder Paternal  Grandmother    Social History  Substance Use Topics  . Smoking status: Former Smoker -- 0.30 packs/day  . Smokeless tobacco: Current User     Comment: 3 cigerettes daily for 3 months   . Alcohol Use: No     Comment: Pt was binge drinking 2-3 times a week-- Quit in July 2013   OB History    No data available     Review of Systems  Constitutional: Positive for chills and fatigue. Negative for fever.  HENT: Positive for congestion and ear pain ("it feels squishy").   Eyes: Negative for pain and redness.  Respiratory: Positive for cough and wheezing. Negative for chest tightness and shortness of breath.   Gastrointestinal: Negative for nausea, vomiting and diarrhea.  Genitourinary: Negative for dysuria, urgency and hematuria.  Musculoskeletal: Positive for myalgias. Negative for back pain.  Skin: Negative for rash.  Neurological: Negative for syncope, weakness and headaches.  Hematological: Does not bruise/bleed easily.    Allergies  Sulfa antibiotics and Ceftin  Home Medications   Prior to Admission medications   Medication Sig Start Date End Date Taking? Authorizing Provider  DULoxetine (CYMBALTA) 60 MG capsule Take 1 capsule (60 mg total) by mouth daily. 09/18/15  Yes Edward Saguier, PA-C  valACYclovir (VALTREX) 1000 MG tablet Take 500 mg by mouth daily.   Yes Historical Provider, MD  amoxicillin-clavulanate (AUGMENTIN) 875-125 MG tablet Take 1 tablet  by mouth every 12 (twelve) hours. 10/23/15   Leta BaptistEmily Roe Nguyen, MD  dexamethasone (DECADRON) 4 MG tablet Take 2.5 tablets (10 mg total) by mouth once. 10/23/15   Leta BaptistEmily Roe Nguyen, MD  diclofenac (VOLTAREN) 75 MG EC tablet TAKE 1 TABLET (75 MG TOTAL) BY MOUTH 2 (TWO) TIMES DAILY. 06/15/15   Ramon DredgeEdward Saguier, PA-C  hydrocortisone (ANUSOL-HC) 25 MG suppository Place 1 suppository (25 mg total) rectally 2 (two) times daily. 05/24/15   Edward Saguier, PA-C   BP 109/62 mmHg  Pulse 121  Temp(Src) 98.3 F (36.8 C) (Oral)  Resp 18  Ht 5'  4" (1.626 m)  Wt 140 lb (63.504 kg)  BMI 24.02 kg/m2  SpO2 96% Physical Exam  Constitutional: She is oriented to person, place, and time. She appears well-developed and well-nourished. She appears ill. No distress.  HENT:  Head: Normocephalic and atraumatic.  Right Ear: External ear normal. Tympanic membrane is injected. Tympanic membrane is not retracted and not bulging. A middle ear effusion is present.  Left Ear: External ear normal. Tympanic membrane is injected and retracted. Tympanic membrane is not bulging. A middle ear effusion (with purulence) is present.  Nose: Right sinus exhibits maxillary sinus tenderness and frontal sinus tenderness. Left sinus exhibits maxillary sinus tenderness and frontal sinus tenderness.  Mouth/Throat: Oropharynx is clear and moist. No oropharyngeal exudate, posterior oropharyngeal edema or posterior oropharyngeal erythema.  Poor transillumination of the bilateral sinuses  Eyes: EOM are normal. Pupils are equal, round, and reactive to light.  Neck: Normal range of motion. Neck supple.  Cardiovascular: Regular rhythm, normal heart sounds and intact distal pulses.  Tachycardia present.   No murmur heard. Pulmonary/Chest: Effort normal. No respiratory distress. She has wheezes (moderate, diffuse, symmetric). She has no rales.  Abdominal: Soft. She exhibits no distension. There is no tenderness.  Musculoskeletal: Normal range of motion. She exhibits no edema or tenderness.  Neurological: She is alert and oriented to person, place, and time.  Skin: Skin is warm and dry. No rash noted. She is not diaphoretic.  Vitals reviewed.   ED Course  Procedures  DIAGNOSTIC STUDIES: Oxygen Saturation is 100% on RA, normal by my interpretation.    COORDINATION OF CARE: 7:33 PM - Discussed normal XR. Discussed plans to order another breathing treatment, as well as a longer-acting steroid and antibiotics. Will write a note for work. Advised pt to eat yoghurt and possibly  take a probiotic. Pt advised of plan for treatment and pt agrees.  Labs Review Labs Reviewed - No data to display  Imaging Review Dg Chest 2 View  10/23/2015  CLINICAL DATA:  Progressively worsening cough for 1 month. Initial encounter. EXAM: CHEST  2 VIEW COMPARISON:  Chest radiograph performed 06/17/2013 FINDINGS: The lungs are well-aerated and clear. There is no evidence of focal opacification, pleural effusion or pneumothorax. The heart is normal in size; the mediastinal contour is within normal limits. No acute osseous abnormalities are seen. IMPRESSION: No acute cardiopulmonary process seen. Electronically Signed   By: Roanna RaiderJeffery  Chang M.D.   On: 10/23/2015 19:05   I have personally reviewed and evaluated these images and lab results as part of my medical decision-making.   EKG Interpretation None      MDM  Patient seen and evaluated in stable condition.  History of wheezing with viral infections.  Wheezing on examination.  Breathing improved tremendously after two breathing treatments.  Patient tolerating PO and hydrating with oral fluids.  Chest xray unremarkable.  Physical examination consistent with sinusitis/otitis  media and patient with symptoms for extended period of time.  Patient given decadron and albuterol inhaler with spacer.  She discharged home with prescription for Augmentin and second dose of decadron.  Strict return precautions given.  Patient and mother expressed understanding and agreement with plan of care. Final diagnoses:  Upper respiratory infection  Acute sinusitis, recurrence not specified, unspecified location    1. Bronchitis with wheeze  2. Sinusitis  3. Otitis media  I personally performed the services described in this documentation, which was scribed in my presence. The recorded information has been reviewed and is accurate.   Leta Baptist, MD 10/24/15 [phone]

## 2015-10-27 ENCOUNTER — Ambulatory Visit (INDEPENDENT_AMBULATORY_CARE_PROVIDER_SITE_OTHER): Payer: 59 | Admitting: Family Medicine

## 2015-10-27 ENCOUNTER — Encounter: Payer: Self-pay | Admitting: Family Medicine

## 2015-10-27 VITALS — BP 112/70 | HR 85 | Temp 98.1°F | Resp 18 | Ht 64.0 in | Wt 149.1 lb

## 2015-10-27 DIAGNOSIS — J40 Bronchitis, not specified as acute or chronic: Secondary | ICD-10-CM

## 2015-10-27 MED ORDER — BECLOMETHASONE DIPROPIONATE 80 MCG/ACT IN AERS: 1.0000 | INHALATION_SPRAY | Freq: Two times a day (BID) | RESPIRATORY_TRACT | Status: DC

## 2015-10-27 MED ORDER — AZITHROMYCIN 250 MG PO TABS: ORAL_TABLET | ORAL | Status: DC

## 2015-10-27 MED ORDER — IPRATROPIUM-ALBUTEROL 0.5-2.5 (3) MG/3ML IN SOLN
3.0000 mL | Freq: Once | RESPIRATORY_TRACT | Status: AC
Start: 2015-10-27 — End: 2015-10-27
  Administered 2015-10-27: 3 mL via RESPIRATORY_TRACT

## 2015-10-27 MED ORDER — PREDNISONE 10 MG PO TABS: ORAL_TABLET | ORAL | Status: DC

## 2015-10-27 MED ORDER — PROMETHAZINE-DM 6.25-15 MG/5ML PO SYRP: 5.0000 mL | ORAL_SOLUTION | Freq: Four times a day (QID) | ORAL | Status: DC | PRN

## 2015-10-27 MED ORDER — METHYLPREDNISOLONE ACETATE 80 MG/ML IJ SUSP
80.0000 mg | Freq: Once | INTRAMUSCULAR | Status: AC
  Administered 2015-10-27: 80 mg via INTRAMUSCULAR

## 2015-10-27 NOTE — Progress Notes (Signed)
   Subjective:    Patient ID: Carolyn Rasmussen, female    DOB: 06-08-1981, 34 y.o.   MRN: [id number]  HPI ER F/U- pt was seen in ER 11/14 and dx'd w/ OM, sinusitis, bronchitis.  Started on Augmentin and Decadron for cough/airway inflammation.  Using albuterol inhaler q2 hrs w/ temporary improvement.  Pt reports being sick for 6 weeks.  Had not been taking allergy meds.  Restarted Zyrtec.  Sunday got 'real bad'.  Monday had wheezing.  Got 2 breathing tx's- made her very 'jittery'.  Cough is now productive.  Sinuses are still draining.  Was sent home from work Wednesday due to cough.   Review of Systems For ROS see HPI     Objective:   Physical Exam  Constitutional: She is oriented to person, place, and time. She appears well-developed and well-nourished. No distress.  HENT:  Head: Normocephalic and atraumatic.  Mild nasal congestion Throat w/out erythema, edema, or exudate  Eyes: Conjunctivae and EOM are normal. Pupils are equal, round, and reactive to light.  Neck: Normal range of motion. Neck supple.  Cardiovascular: Normal rate, regular rhythm, normal heart sounds and intact distal pulses.   No murmur heard. Pulmonary/Chest: Effort normal. No respiratory distress. She has wheezes (inspiratory and expiratory wheezes- improved s/p neb tx).  + hacking cough Diffuse rhonchi  Lymphadenopathy:    She has no cervical adenopathy.  Neurological: She is alert and oriented to person, place, and time.  Skin: Skin is warm and dry.  Vitals reviewed.         Assessment & Plan:

## 2015-10-27 NOTE — Assessment & Plan Note (Signed)
Pt w/ acute bronchitis w/ severe bronchospasm.  Currently on Augmentin for OM and sinusitis.  Had normal CXR 11/14.  Pt w/ diffuse wheezing and rhonchi, SOB despite O2 sats of 97%.  Took 2nd dose of Decadron on Wednesday w/ minimal improvement.  Will add Zpack to cover for atypical process.  Start Pred taper.  Albuterol prn.  Add Qvar twice daily until sxs improve.  Reviewed supportive care and red flags that should prompt return.  Pt expressed understanding and is in agreement w/ plan.

## 2015-10-27 NOTE — Patient Instructions (Signed)
Follow up as needed Start the Zpack in addition to the Augmentin for the bronchitis Drink plenty of fluids Use the albuterol as needed Add the Qvar- 1 puff twice daily- until feeling better Use the cough syrup for nights/weekends- will make you drowsy REST!! Call with any questions or concerns- particularly if breathing worsens Hang in there!!!

## 2015-10-27 NOTE — Progress Notes (Signed)
Pre visit review using our clinic review tool, if applicable. No additional management support is needed unless otherwise documented below in the visit note. 

## 2016-02-13 ENCOUNTER — Ambulatory Visit (INDEPENDENT_AMBULATORY_CARE_PROVIDER_SITE_OTHER): Payer: 59 | Admitting: Family Medicine

## 2016-02-13 ENCOUNTER — Encounter: Payer: Self-pay | Admitting: Family Medicine

## 2016-02-13 VITALS — BP 108/66 | HR 61 | Temp 98.6°F | Wt 146.0 lb

## 2016-02-13 DIAGNOSIS — F4323 Adjustment disorder with mixed anxiety and depressed mood: Secondary | ICD-10-CM | POA: Diagnosis not present

## 2016-02-13 DIAGNOSIS — J302 Other seasonal allergic rhinitis: Secondary | ICD-10-CM | POA: Diagnosis not present

## 2016-02-13 MED ORDER — DULOXETINE HCL 30 MG PO CPEP: 30.0000 mg | ORAL_CAPSULE | Freq: Every day | ORAL | Status: DC

## 2016-02-13 MED ORDER — LEVOCETIRIZINE DIHYDROCHLORIDE 5 MG PO TABS: 5.0000 mg | ORAL_TABLET | Freq: Every evening | ORAL | Status: DC

## 2016-02-13 MED ORDER — FLUTICASONE PROPIONATE 50 MCG/ACT NA SUSP: 2.0000 | Freq: Every day | NASAL | Status: DC

## 2016-02-13 NOTE — Progress Notes (Signed)
Pre visit review using our clinic review tool, if applicable. No additional management support is needed unless otherwise documented below in the visit note. 

## 2016-02-13 NOTE — Patient Instructions (Signed)

## 2016-02-13 NOTE — Progress Notes (Signed)
  Subjective:     Carolyn Rasmussen is a 35 y.o. female who presents for evaluation of symptoms of a URI. Symptoms include bilateral ear pressure/pain, congestion, facial pain, no  fever, non productive cough, post nasal drip, sneezing and sore throat. Onset of symptoms was 1 week ago, and has been unchanged since that time. Treatment to date: antihistamines.  The following portions of the patient's history were reviewed and updated as appropriate:  She  has a past medical history of Depression; Allergy; GERD (gastroesophageal reflux disease); Chronic kidney disease (2002); Anxiety; and Genital herpes. She  does not have any pertinent problems on file. She  has past surgical history that includes tubes in ears; typanoplasty; and Kidney stone surgery. Her family history includes Anxiety disorder in her maternal grandfather, maternal grandmother, paternal grandfather, and paternal grandmother; Heart disease in her father; Hyperlipidemia in her father; Hypertension in her mother. She  reports that she has quit smoking. She uses smokeless tobacco. She reports that she uses illicit drugs (Marijuana). She reports that she does not drink alcohol. She has a current medication list which includes the following prescription(s): duloxetine, fluticasone, and levocetirizine. Current Outpatient Prescriptions on File Prior to Visit  Medication Sig Dispense Refill  . [DISCONTINUED] esomeprazole (NEXIUM) 40 MG capsule Take 1 capsule (40 mg total) by mouth daily before breakfast. 30 capsule 0   No current facility-administered medications on file prior to visit.   She is allergic to sulfa antibiotics and ceftin..  Review of Systems Pertinent items are noted in HPI.   Objective:    BP 108/66 mmHg  Pulse 61  Temp(Src) 98.6 F (37 C) (Oral)  Wt 146 lb (66.225 kg)  SpO2 98% General appearance: alert, cooperative, appears stated age and no distress Ears: fluid b/l Nose: clear discharge, moderate  congestion, turbinates red, swollen, sinus tenderness bilateral Throat: abnormal findings: mild oropharyngeal erythema Neck: no adenopathy, no carotid bruit, no JVD, supple, symmetrical, trachea midline and thyroid not enlarged, symmetric, no tenderness/mass/nodules Lungs: clear to auscultation bilaterally Heart: S1, S2 normal Extremities: extremities normal, atraumatic, no cyanosis or edema   Assessment:    viral upper respiratory illness   Plan:    Discussed diagnosis and treatment of URI. Suggested symptomatic OTC remedies. Nasal saline spray for congestion. Nasal steroids per orders. Follow up as needed. antihistamine per orders

## 2016-03-16 ENCOUNTER — Other Ambulatory Visit: Payer: Self-pay | Admitting: Family Medicine

## 2016-04-09 ENCOUNTER — Telehealth: Payer: Self-pay | Admitting: Family Medicine

## 2016-04-09 NOTE — Telephone Encounter (Signed)
Ok to switch 

## 2016-04-09 NOTE — Telephone Encounter (Signed)
Pt wanting to change PCP from Dr. Beverely Lowabori to Carolyn Coganody Martin PA as she will not be able to go out to the [place] office. She is needing appt for anxiety/depression. Please advise.

## 2016-04-09 NOTE — Telephone Encounter (Signed)
Fine with me

## 2016-04-10 NOTE — Telephone Encounter (Signed)
Patient scheduled for 05/08/16 patient would like her physical conducted at that time.

## 2016-04-10 NOTE — Telephone Encounter (Signed)
LM for pt to call and schedule 30 min OV to transfer to [place] as PCP

## 2016-04-11 ENCOUNTER — Ambulatory Visit (INDEPENDENT_AMBULATORY_CARE_PROVIDER_SITE_OTHER): Payer: 59 | Admitting: Family Medicine

## 2016-04-11 ENCOUNTER — Emergency Department (HOSPITAL_COMMUNITY)
Admission: EM | Admit: 2016-04-11 | Discharge: 2016-04-11 | Payer: PRIVATE HEALTH INSURANCE | Attending: Emergency Medicine | Admitting: Emergency Medicine

## 2016-04-11 ENCOUNTER — Encounter: Payer: Self-pay | Admitting: Family Medicine

## 2016-04-11 ENCOUNTER — Encounter (HOSPITAL_COMMUNITY): Payer: Self-pay | Admitting: Emergency Medicine

## 2016-04-11 VITALS — BP 114/80 | HR 68 | Temp 98.1°F | Ht 65.5 in | Wt 138.4 lb

## 2016-04-11 DIAGNOSIS — Z8619 Personal history of other infectious and parasitic diseases: Secondary | ICD-10-CM | POA: Insufficient documentation

## 2016-04-11 DIAGNOSIS — F419 Anxiety disorder, unspecified: Secondary | ICD-10-CM | POA: Diagnosis not present

## 2016-04-11 DIAGNOSIS — N189 Chronic kidney disease, unspecified: Secondary | ICD-10-CM | POA: Insufficient documentation

## 2016-04-11 DIAGNOSIS — F322 Major depressive disorder, single episode, severe without psychotic features: Secondary | ICD-10-CM

## 2016-04-11 DIAGNOSIS — Z3202 Encounter for pregnancy test, result negative: Secondary | ICD-10-CM | POA: Diagnosis not present

## 2016-04-11 DIAGNOSIS — Z7952 Long term (current) use of systemic steroids: Secondary | ICD-10-CM | POA: Diagnosis not present

## 2016-04-11 DIAGNOSIS — F32A Depression, unspecified: Secondary | ICD-10-CM

## 2016-04-11 DIAGNOSIS — R45851 Suicidal ideations: Secondary | ICD-10-CM

## 2016-04-11 DIAGNOSIS — Z87891 Personal history of nicotine dependence: Secondary | ICD-10-CM | POA: Diagnosis not present

## 2016-04-11 DIAGNOSIS — Z79899 Other long term (current) drug therapy: Secondary | ICD-10-CM | POA: Diagnosis not present

## 2016-04-11 DIAGNOSIS — F329 Major depressive disorder, single episode, unspecified: Secondary | ICD-10-CM | POA: Insufficient documentation

## 2016-04-11 DIAGNOSIS — F1210 Cannabis abuse, uncomplicated: Secondary | ICD-10-CM | POA: Insufficient documentation

## 2016-04-11 DIAGNOSIS — Z8719 Personal history of other diseases of the digestive system: Secondary | ICD-10-CM | POA: Insufficient documentation

## 2016-04-11 LAB — COMPREHENSIVE METABOLIC PANEL
ALBUMIN: 4.1 g/dL (ref 3.5–5.0)
ALK PHOS: 55 U/L (ref 38–126)
ALT: 13 U/L — ABNORMAL LOW (ref 14–54)
ANION GAP: 7 (ref 5–15)
AST: 19 U/L (ref 15–41)
BUN: 19 mg/dL (ref 6–20)
CALCIUM: 9.1 mg/dL (ref 8.9–10.3)
CHLORIDE: 105 mmol/L (ref 101–111)
CO2: 24 mmol/L (ref 22–32)
Creatinine, Ser: 0.59 mg/dL (ref 0.44–1.00)
GFR calc non Af Amer: >60 mL/min (ref >60)
GLUCOSE: 101 mg/dL — AB (ref 65–99)
POTASSIUM: 3.9 mmol/L (ref 3.5–5.1)
SODIUM: 136 mmol/L (ref 135–145)
Total Bilirubin: 0.4 mg/dL (ref 0.3–1.2)
Total Protein: 6.9 g/dL (ref 6.5–8.1)

## 2016-04-11 LAB — CBC
HEMATOCRIT: 40.4 % (ref 36.0–46.0)
HEMOGLOBIN: 13.5 g/dL (ref 12.0–15.0)
MCH: 31.1 pg (ref 26.0–34.0)
MCHC: 33.4 g/dL (ref 30.0–36.0)
MCV: 93.1 fL (ref 78.0–100.0)
Platelets: 174 10*3/uL (ref 150–400)
RBC: 4.34 MIL/uL (ref 3.87–5.11)
RDW: 13.1 % (ref 11.5–15.5)
WBC: 8.5 10*3/uL (ref 4.0–10.5)

## 2016-04-11 LAB — RAPID URINE DRUG SCREEN, HOSP PERFORMED
Amphetamines: NOT DETECTED
BARBITURATES: NOT DETECTED
BENZODIAZEPINES: NOT DETECTED
COCAINE: NOT DETECTED
OPIATES: NOT DETECTED
TETRAHYDROCANNABINOL: POSITIVE — AB

## 2016-04-11 LAB — ETHANOL: Alcohol, Ethyl (B): <5 mg/dL (ref <5)

## 2016-04-11 LAB — I-STAT BETA HCG BLOOD, ED (MC, WL, AP ONLY): I-stat hCG, quantitative: <5 m[IU]/mL (ref <5)

## 2016-04-11 LAB — SALICYLATE LEVEL

## 2016-04-11 LAB — ACETAMINOPHEN LEVEL

## 2016-04-11 MED ORDER — LORAZEPAM 1 MG PO TABS: 1.0000 mg | ORAL_TABLET | Freq: Three times a day (TID) | ORAL | Status: DC | PRN

## 2016-04-11 MED ORDER — IBUPROFEN 200 MG PO TABS: 600.0000 mg | ORAL_TABLET | Freq: Three times a day (TID) | ORAL | Status: DC | PRN

## 2016-04-11 MED ORDER — DULOXETINE HCL 60 MG PO CPEP
60.0000 mg | ORAL_CAPSULE | Freq: Every day | ORAL | Status: DC
Start: 2016-04-12 — End: 2016-04-11

## 2016-04-11 MED ORDER — ZOLPIDEM TARTRATE 5 MG PO TABS: 5.0000 mg | ORAL_TABLET | Freq: Every evening | ORAL | Status: DC | PRN

## 2016-04-11 MED ORDER — LORATADINE 10 MG PO TABS
10.0000 mg | ORAL_TABLET | Freq: Every evening | ORAL | Status: DC
  Administered 2016-04-11: 10 mg via ORAL
  Filled 2016-04-11: qty 1

## 2016-04-11 MED ORDER — ACETAMINOPHEN 325 MG PO TABS: 650.0000 mg | ORAL_TABLET | ORAL | Status: DC | PRN

## 2016-04-11 MED ORDER — ONDANSETRON HCL 4 MG PO TABS: 4.0000 mg | ORAL_TABLET | Freq: Three times a day (TID) | ORAL | Status: DC | PRN

## 2016-04-11 MED ORDER — FLUTICASONE PROPIONATE 50 MCG/ACT NA SUSP
2.0000 | Freq: Every day | NASAL | Status: DC
Start: 2016-04-12 — End: 2016-04-11
  Filled 2016-04-11: qty 16

## 2016-04-11 MED ORDER — LEVOCETIRIZINE DIHYDROCHLORIDE 5 MG PO TABS: 5.0000 mg | ORAL_TABLET | Freq: Every evening | ORAL | Status: DC

## 2016-04-11 MED ORDER — ALUM & MAG HYDROXIDE-SIMETH 200-200-20 MG/5ML PO SUSP: 30.0000 mL | ORAL | Status: DC | PRN

## 2016-04-11 NOTE — ED Notes (Signed)
Patient being brought by father for SI

## 2016-04-11 NOTE — ED Notes (Signed)
Pt reports suicidal , no plan. sts has been on cymbalta since 12/2015. Has been seen by psychiatrist and counselor.  [facility] and they have been helpful.

## 2016-04-11 NOTE — BH Assessment (Signed)
[facility] Assessment Progress Note  Per Dahlia ByesJosephine Onuoha, NP, this pt continues to require psychiatric hospitalization.  The following facilities have been contacted to seek placement for this pt, with results as noted:  Beds available, information sent, decision pending:  [facility]   At capacity:  [facility]   Doylene Canninghomas Masud Holub, [place] Triage Specialist [phone]

## 2016-04-11 NOTE — Patient Instructions (Signed)
I am sorry that you are having such a hard time. We are glad to help in any way possible. For now please proceed [facility] for [facility].  I will call ahead for you.   It is essential that we find you a psychiatrist asap so you have someone to help you in times of need.   You might try: [facility]  [address]  [phone]
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
## 2016-04-11 NOTE — Progress Notes (Signed)
Pre visit review using our clinic tool,if applicable. No additional management support is needed unless otherwise documented below in the visit note.  
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
## 2016-04-11 NOTE — Progress Notes (Signed)
CSW received call from Lisa/ [facility] who states that their facility has offered pt a bed.  She states that pt is welcomed to come tonight. She is aware that pt is voluntary. She states that pt can come to their [address], but their facility does not provide transportation.  Accepting Physician: Dr.Wang  Nurse Report Number: [phone]  CSW made NP aware. CSW made nurse aware.  Trish MageBrittney Kaiulani Sitton, LCSWA [phone] ED CSW 04/11/2016 5:55 PM
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
## 2016-05-08 ENCOUNTER — Ambulatory Visit (INDEPENDENT_AMBULATORY_CARE_PROVIDER_SITE_OTHER): Payer: 59 | Admitting: Physician Assistant

## 2016-05-08 ENCOUNTER — Encounter: Payer: Self-pay | Admitting: Physician Assistant

## 2016-05-08 VITALS — BP 114/68 | HR 82 | Temp 98.1°F | Resp 16 | Ht 66.0 in | Wt 142.5 lb

## 2016-05-08 DIAGNOSIS — M25559 Pain in unspecified hip: Secondary | ICD-10-CM | POA: Diagnosis not present

## 2016-05-08 DIAGNOSIS — J302 Other seasonal allergic rhinitis: Secondary | ICD-10-CM | POA: Diagnosis not present

## 2016-05-08 DIAGNOSIS — Z0001 Encounter for general adult medical examination with abnormal findings: Secondary | ICD-10-CM | POA: Diagnosis not present

## 2016-05-08 DIAGNOSIS — Z0000 Encounter for general adult medical examination without abnormal findings: Secondary | ICD-10-CM | POA: Diagnosis not present

## 2016-05-08 DIAGNOSIS — M25569 Pain in unspecified knee: Secondary | ICD-10-CM

## 2016-05-08 MED ORDER — AZELASTINE HCL 0.1 % NA SOLN: 1.0000 | Freq: Two times a day (BID) | NASAL | Status: DC

## 2016-05-08 MED ORDER — CELECOXIB 200 MG PO CAPS: 200.0000 mg | ORAL_CAPSULE | Freq: Every day | ORAL | Status: DC

## 2016-05-08 MED ORDER — LEVOCETIRIZINE DIHYDROCHLORIDE 5 MG PO TABS: 5.0000 mg | ORAL_TABLET | Freq: Every evening | ORAL | Status: DC
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
## 2016-05-08 NOTE — Patient Instructions (Signed)
I will call you with your lab results.  Please continue chronic medications and follow-up with Psychiatry as scheduled.  Start the Celebrex for joint pain. Keep up with your exercises. Follow-up with me in 1 month.  Start the Astelin spray in addition to your Xyzal and Flonase for allergy relief. Let me know how this is working for you.  If your blood work is normal we will follow-up each year for physicals and as scheduled for chronic medical problems.  If anything is abnormal we will treat accordingly and get you in for a follow-up.  Preventive Care for Adults, Female A healthy lifestyle and preventive care can promote health and wellness. Preventive health guidelines for women include the following key practices.  A routine yearly physical is a good way to check with your health care provider about your health and preventive screening. It is a chance to share any concerns and updates on your health and to receive a thorough exam.  Visit your dentist for a routine exam and preventive care every 6 months. Brush your teeth twice a day and floss once a day. Good oral hygiene prevents tooth decay and gum disease.  The frequency of eye exams is based on your age, health, family medical history, use of contact lenses, and other factors. Follow your health care provider's recommendations for frequency of eye exams.  Eat a healthy diet. Foods like vegetables, fruits, whole grains, low-fat dairy products, and lean protein foods contain the nutrients you need without too many calories. Decrease your intake of foods high in solid fats, added sugars, and salt. Eat the right amount of calories for you.Get information about a proper diet from your health care provider, if necessary.  Regular physical exercise is one of the most important things you can do for your health. Most adults should get at least 150 minutes of moderate-intensity exercise (any activity that increases your heart rate and causes you  to sweat) each week. In addition, most adults need muscle-strengthening exercises on 2 or more days a week.  Maintain a healthy weight. The body mass index (BMI) is a screening tool to identify possible weight problems. It provides an estimate of body fat based on height and weight. Your health care provider can find your BMI and can help you achieve or maintain a healthy weight.For adults 20 years and older:  A BMI below 18.5 is considered underweight.  A BMI of 18.5 to 24.9 is normal.  A BMI of 25 to 29.9 is considered overweight.  A BMI of 30 and above is considered obese.  Maintain normal blood lipids and cholesterol levels by exercising and minimizing your intake of saturated fat. Eat a balanced diet with plenty of fruit and vegetables. Blood tests for lipids and cholesterol should begin at age 61 and be repeated every 5 years. If your lipid or cholesterol levels are high, you are over 50, or you are at high risk for heart disease, you may need your cholesterol levels checked more frequently.Ongoing high lipid and cholesterol levels should be treated with medicines if diet and exercise are not working.  If you smoke, find out from your health care provider how to quit. If you do not use tobacco, do not start.  Lung cancer screening is recommended for adults aged 42-80 years who are at high risk for developing lung cancer because of a history of smoking. A yearly low-dose CT scan of the lungs is recommended for people who have at least a 30-pack-year history  of smoking and are a current smoker or have quit within the past 15 years. A pack year of smoking is smoking an average of 1 pack of cigarettes a day for 1 year (for example: 1 pack a day for 30 years or 2 packs a day for 15 years). Yearly screening should continue until the smoker has stopped smoking for at least 15 years. Yearly screening should be stopped for people who develop a health problem that would prevent them from having lung  cancer treatment.  If you are pregnant, do not drink alcohol. If you are breastfeeding, be very cautious about drinking alcohol. If you are not pregnant and choose to drink alcohol, do not have more than 1 drink per day. One drink is considered to be 12 ounces (355 mL) of beer, 5 ounces (148 mL) of wine, or 1.5 ounces (44 mL) of liquor.  Avoid use of street drugs. Do not share needles with anyone. Ask for help if you need support or instructions about stopping the use of drugs.  High blood pressure causes heart disease and increases the risk of stroke. Your blood pressure should be checked at least every 1 to 2 years. Ongoing high blood pressure should be treated with medicines if weight loss and exercise do not work.  If you are 31-81 years old, ask your health care provider if you should take aspirin to prevent strokes.  Diabetes screening is done by taking a blood sample to check your blood glucose level after you have not eaten for a certain period of time (fasting). If you are not overweight and you do not have risk factors for diabetes, you should be screened once every 3 years starting at age 37. If you are overweight or obese and you are 45-47 years of age, you should be screened for diabetes every year as part of your cardiovascular risk assessment.  Breast cancer screening is essential preventive care for women. You should practice "breast self-awareness." This means understanding the normal appearance and feel of your breasts and may include breast self-examination. Any changes detected, no matter how small, should be reported to a health care provider. Women in their 25s and 30s should have a clinical breast exam (CBE) by a health care provider as part of a regular health exam every 1 to 3 years. After age 22, women should have a CBE every year. Starting at age 54, women should consider having a mammogram (breast X-ray test) every year. Women who have a family history of breast cancer should  talk to their health care provider about genetic screening. Women at a high risk of breast cancer should talk to their health care providers about having an MRI and a mammogram every year.  Breast cancer gene (BRCA)-related cancer risk assessment is recommended for women who have family members with BRCA-related cancers. BRCA-related cancers include breast, ovarian, tubal, and peritoneal cancers. Having family members with these cancers may be associated with an increased risk for harmful changes (mutations) in the breast cancer genes BRCA1 and BRCA2. Results of the assessment will determine the need for genetic counseling and BRCA1 and BRCA2 testing.  Your health care provider may recommend that you be screened regularly for cancer of the pelvic organs (ovaries, uterus, and vagina). This screening involves a pelvic examination, including checking for microscopic changes to the surface of your cervix (Pap test). You may be encouraged to have this screening done every 3 years, beginning at age 2.  For women ages 32-65, health  care providers may recommend pelvic exams and Pap testing every 3 years, or they may recommend the Pap and pelvic exam, combined with testing for human papilloma virus (HPV), every 5 years. Some types of HPV increase your risk of cervical cancer. Testing for HPV may also be done on women of any age with unclear Pap test results.  Other health care providers may not recommend any screening for nonpregnant women who are considered low risk for pelvic cancer and who do not have symptoms. Ask your health care provider if a screening pelvic exam is right for you.  If you have had past treatment for cervical cancer or a condition that could lead to cancer, you need Pap tests and screening for cancer for at least 20 years after your treatment. If Pap tests have been discontinued, your risk factors (such as having a new sexual partner) need to be reassessed to determine if screening should  resume. Some women have medical problems that increase the chance of getting cervical cancer. In these cases, your health care provider may recommend more frequent screening and Pap tests.  Colorectal cancer can be detected and often prevented. Most routine colorectal cancer screening begins at the age of 29 years and continues through age 19 years. However, your health care provider may recommend screening at an earlier age if you have risk factors for colon cancer. On a yearly basis, your health care provider may provide home test kits to check for hidden blood in the stool. Use of a small camera at the end of a tube, to directly examine the colon (sigmoidoscopy or colonoscopy), can detect the earliest forms of colorectal cancer. Talk to your health care provider about this at age 18, when routine screening begins. Direct exam of the colon should be repeated every 5-10 years through age 50 years, unless early forms of precancerous polyps or small growths are found.  People who are at an increased risk for hepatitis B should be screened for this virus. You are considered at high risk for hepatitis B if:  You were born in a country where hepatitis B occurs often. Talk with your health care provider about which countries are considered high risk.  Your parents were born in a high-risk country and you have not received a shot to protect against hepatitis B (hepatitis B vaccine).  You have HIV or AIDS.  You use needles to inject street drugs.  You live with, or have sex with, someone who has hepatitis B.  You get hemodialysis treatment.  You take certain medicines for conditions like cancer, organ transplantation, and autoimmune conditions.  Hepatitis C blood testing is recommended for all people born from [birth year] through 1966 and any individual with known risks for hepatitis C.  Practice safe sex. Use condoms and avoid high-risk sexual practices to reduce the spread of sexually transmitted  infections (STIs). STIs include gonorrhea, chlamydia, syphilis, trichomonas, herpes, HPV, and human immunodeficiency virus (HIV). Herpes, HIV, and HPV are viral illnesses that have no cure. They can result in disability, cancer, and death.  You should be screened for sexually transmitted illnesses (STIs) including gonorrhea and chlamydia if:  You are sexually active and are younger than 24 years.  You are older than 24 years and your health care provider tells you that you are at risk for this type of infection.  Your sexual activity has changed since you were last screened and you are at an increased risk for chlamydia or gonorrhea. Ask your health care  provider if you are at risk.  If you are at risk of being infected with HIV, it is recommended that you take a prescription medicine daily to prevent HIV infection. This is called preexposure prophylaxis (PrEP). You are considered at risk if:  You are sexually active and do not regularly use condoms or know the HIV status of your partner(s).  You take drugs by injection.  You are sexually active with a partner who has HIV.  Talk with your health care provider about whether you are at high risk of being infected with HIV. If you choose to begin PrEP, you should first be tested for HIV. You should then be tested every 3 months for as long as you are taking PrEP.  Osteoporosis is a disease in which the bones lose minerals and strength with aging. This can result in serious bone fractures or breaks. The risk of osteoporosis can be identified using a bone density scan. Women ages 40 years and over and women at risk for fractures or osteoporosis should discuss screening with their health care providers. Ask your health care provider whether you should take a calcium supplement or vitamin D to reduce the rate of osteoporosis.  Menopause can be associated with physical symptoms and risks. Hormone replacement therapy is available to decrease symptoms  and risks. You should talk to your health care provider about whether hormone replacement therapy is right for you.  Use sunscreen. Apply sunscreen liberally and repeatedly throughout the day. You should seek shade when your shadow is shorter than you. Protect yourself by wearing long sleeves, pants, a wide-brimmed hat, and sunglasses year round, whenever you are outdoors.  Once a month, do a whole body skin exam, using a mirror to look at the skin on your back. Tell your health care provider of new moles, moles that have irregular borders, moles that are larger than a pencil eraser, or moles that have changed in shape or color.  Stay current with required vaccines (immunizations).  Influenza vaccine. All adults should be immunized every year.  Tetanus, diphtheria, and acellular pertussis (Td, Tdap) vaccine. Pregnant women should receive 1 dose of Tdap vaccine during each pregnancy. The dose should be obtained regardless of the length of time since the last dose. Immunization is preferred during the 27th-36th week of gestation. An adult who has not previously received Tdap or who does not know her vaccine status should receive 1 dose of Tdap. This initial dose should be followed by tetanus and diphtheria toxoids (Td) booster doses every 10 years. Adults with an unknown or incomplete history of completing a 3-dose immunization series with Td-containing vaccines should begin or complete a primary immunization series including a Tdap dose. Adults should receive a Td booster every 10 years.  Varicella vaccine. An adult without evidence of immunity to varicella should receive 2 doses or a second dose if she has previously received 1 dose. Pregnant females who do not have evidence of immunity should receive the first dose after pregnancy. This first dose should be obtained before leaving the health care facility. The second dose should be obtained 4-8 weeks after the first dose.  Human papillomavirus (HPV)  vaccine. Females aged 13-26 years who have not received the vaccine previously should obtain the 3-dose series. The vaccine is not recommended for use in pregnant females. However, pregnancy testing is not needed before receiving a dose. If a female is found to be pregnant after receiving a dose, no treatment is needed. In that case,  the remaining doses should be delayed until after the pregnancy. Immunization is recommended for any person with an immunocompromised condition through the age of 101 years if she did not get any or all doses earlier. During the 3-dose series, the second dose should be obtained 4-8 weeks after the first dose. The third dose should be obtained 24 weeks after the first dose and 16 weeks after the second dose.  Zoster vaccine. One dose is recommended for adults aged 40 years or older unless certain conditions are present.  Measles, mumps, and rubella (MMR) vaccine. Adults born before [birth year] generally are considered immune to measles and mumps. Adults born in [birth year] or later should have 1 or more doses of MMR vaccine unless there is a contraindication to the vaccine or there is laboratory evidence of immunity to each of the three diseases. A routine second dose of MMR vaccine should be obtained at least 28 days after the first dose for students attending postsecondary schools, health care workers, or international travelers. People who received inactivated measles vaccine or an unknown type of measles vaccine during 1963-1967 should receive 2 doses of MMR vaccine. People who received inactivated mumps vaccine or an unknown type of mumps vaccine before 1979 and are at high risk for mumps infection should consider immunization with 2 doses of MMR vaccine. For females of childbearing age, rubella immunity should be determined. If there is no evidence of immunity, females who are not pregnant should be vaccinated. If there is no evidence of immunity, females who are pregnant should delay  immunization until after pregnancy. Unvaccinated health care workers born before [birth year] who lack laboratory evidence of measles, mumps, or rubella immunity or laboratory confirmation of disease should consider measles and mumps immunization with 2 doses of MMR vaccine or rubella immunization with 1 dose of MMR vaccine.  Pneumococcal 13-valent conjugate (PCV13) vaccine. When indicated, a person who is uncertain of his immunization history and has no record of immunization should receive the PCV13 vaccine. All adults 77 years of age and older should receive this vaccine. An adult aged 67 years or older who has certain medical conditions and has not been previously immunized should receive 1 dose of PCV13 vaccine. This PCV13 should be followed with a dose of pneumococcal polysaccharide (PPSV23) vaccine. Adults who are at high risk for pneumococcal disease should obtain the PPSV23 vaccine at least 8 weeks after the dose of PCV13 vaccine. Adults older than 36 years of age who have normal immune system function should obtain the PPSV23 vaccine dose at least 1 year after the dose of PCV13 vaccine.  Pneumococcal polysaccharide (PPSV23) vaccine. When PCV13 is also indicated, PCV13 should be obtained first. All adults aged 52 years and older should be immunized. An adult younger than age 55 years who has certain medical conditions should be immunized. Any person who resides in a nursing home or long-term care facility should be immunized. An adult smoker should be immunized. People with an immunocompromised condition and certain other conditions should receive both PCV13 and PPSV23 vaccines. People with human immunodeficiency virus (HIV) infection should be immunized as soon as possible after diagnosis. Immunization during chemotherapy or radiation therapy should be avoided. Routine use of PPSV23 vaccine is not recommended for American Indians, [place] Natives, or people younger than 65 years unless there are medical  conditions that require PPSV23 vaccine. When indicated, people who have unknown immunization and have no record of immunization should receive PPSV23 vaccine. One-time revaccination 5 years after the first  dose of PPSV23 is recommended for people aged 19-64 years who have chronic kidney failure, nephrotic syndrome, asplenia, or immunocompromised conditions. People who received 1-2 doses of PPSV23 before age 74 years should receive another dose of PPSV23 vaccine at age 75 years or later if at least 5 years have passed since the previous dose. Doses of PPSV23 are not needed for people immunized with PPSV23 at or after age 83 years.  Meningococcal vaccine. Adults with asplenia or persistent complement component deficiencies should receive 2 doses of quadrivalent meningococcal conjugate (MenACWY-D) vaccine. The doses should be obtained at least 2 months apart. Microbiologists working with certain meningococcal bacteria, [place] recruits, people at risk during an outbreak, and people who travel to or live in countries with a high rate of meningitis should be immunized. A first-year college student up through age 42 years who is living in a residence hall should receive a dose if she did not receive a dose on or after her 16th birthday. Adults who have certain high-risk conditions should receive one or more doses of vaccine.  Hepatitis A vaccine. Adults who wish to be protected from this disease, have certain high-risk conditions, work with hepatitis A-infected animals, work in hepatitis A research labs, or travel to or work in countries with a high rate of hepatitis A should be immunized. Adults who were previously unvaccinated and who anticipate close contact with an international adoptee during the first 60 days after arrival in the [place] from a country with a high rate of hepatitis A should be immunized.  Hepatitis B vaccine. Adults who wish to be protected from this disease, have certain high-risk  conditions, may be exposed to blood or other infectious body fluids, are household contacts or sex partners of hepatitis B positive people, are clients or workers in certain care facilities, or travel to or work in countries with a high rate of hepatitis B should be immunized.  Haemophilus influenzae type b (Hib) vaccine. A previously unvaccinated person with asplenia or sickle cell disease or having a scheduled splenectomy should receive 1 dose of Hib vaccine. Regardless of previous immunization, a recipient of a hematopoietic stem cell transplant should receive a 3-dose series 6-12 months after her successful transplant. Hib vaccine is not recommended for adults with HIV infection. Preventive Services / Frequency Ages 55 to 13 years  Blood pressure check.** / Every 3-5 years.  Lipid and cholesterol check.** / Every 5 years beginning at age 81.  Clinical breast exam.** / Every 3 years for women in their 68s and 2s.  BRCA-related cancer risk assessment.** / For women who have family members with a BRCA-related cancer (breast, ovarian, tubal, or peritoneal cancers).  Pap test.** / Every 2 years from ages 73 through 17. Every 3 years starting at age 38 through age 57 or 73 with a history of 3 consecutive normal Pap tests.  HPV screening.** / Every 3 years from ages 36 through ages 77 to 47 with a history of 3 consecutive normal Pap tests.  Hepatitis C blood test.** / For any individual with known risks for hepatitis C.  Skin self-exam. / Monthly.  Influenza vaccine. / Every year.  Tetanus, diphtheria, and acellular pertussis (Tdap, Td) vaccine.** / Consult your health care provider. Pregnant women should receive 1 dose of Tdap vaccine during each pregnancy. 1 dose of Td every 10 years.  Varicella vaccine.** / Consult your health care provider. Pregnant females who do not have evidence of immunity should receive the first dose after  pregnancy.  HPV vaccine. / 3 doses over 6 months, if 26  and younger. The vaccine is not recommended for use in pregnant females. However, pregnancy testing is not needed before receiving a dose.  Measles, mumps, rubella (MMR) vaccine.** / You need at least 1 dose of MMR if you were born in 1957 or later. You may also need a 2nd dose. For females of childbearing age, rubella immunity should be determined. If there is no evidence of immunity, females who are not pregnant should be vaccinated. If there is no evidence of immunity, females who are pregnant should delay immunization until after pregnancy.  Pneumococcal 13-valent conjugate (PCV13) vaccine.** / Consult your health care provider.  Pneumococcal polysaccharide (PPSV23) vaccine.** / 1 to 2 doses if you smoke cigarettes or if you have certain conditions.  Meningococcal vaccine.** / 1 dose if you are age 32 to 66 years and a Orthoptist living in a residence hall, or have one of several medical conditions, you need to get vaccinated against meningococcal disease. You may also need additional booster doses.  Hepatitis A vaccine.** / Consult your health care provider.  Hepatitis B vaccine.** / Consult your health care provider.  Haemophilus influenzae type b (Hib) vaccine.** / Consult your health care provider. Ages 27 to 64 years  Blood pressure check.** / Every year.  Lipid and cholesterol check.** / Every 5 years beginning at age 64 years.  Lung cancer screening. / Every year if you are aged 55-80 years and have a 30-pack-year history of smoking and currently smoke or have quit within the past 15 years. Yearly screening is stopped once you have quit smoking for at least 15 years or develop a health problem that would prevent you from having lung cancer treatment.  Clinical breast exam.** / Every year after age 70 years.  BRCA-related cancer risk assessment.** / For women who have family members with a BRCA-related cancer (breast, ovarian, tubal, or peritoneal  cancers).  Mammogram.** / Every year beginning at age 10 years and continuing for as long as you are in good health. Consult with your health care provider.  Pap test.** / Every 3 years starting at age 57 years through age 33 or 71 years with a history of 3 consecutive normal Pap tests.  HPV screening.** / Every 3 years from ages 48 years through ages 92 to 42 years with a history of 3 consecutive normal Pap tests.  Fecal occult blood test (FOBT) of stool. / Every year beginning at age 73 years and continuing until age 65 years. You may not need to do this test if you get a colonoscopy every 10 years.  Flexible sigmoidoscopy or colonoscopy.** / Every 5 years for a flexible sigmoidoscopy or every 10 years for a colonoscopy beginning at age 66 years and continuing until age 34 years.  Hepatitis C blood test.** / For all people born from [birth year] through 1966 and any individual with known risks for hepatitis C.  Skin self-exam. / Monthly.  Influenza vaccine. / Every year.  Tetanus, diphtheria, and acellular pertussis (Tdap/Td) vaccine.** / Consult your health care provider. Pregnant women should receive 1 dose of Tdap vaccine during each pregnancy. 1 dose of Td every 10 years.  Varicella vaccine.** / Consult your health care provider. Pregnant females who do not have evidence of immunity should receive the first dose after pregnancy.  Zoster vaccine.** / 1 dose for adults aged 27 years or older.  Measles, mumps, rubella (MMR) vaccine.** / You need  at least 1 dose of MMR if you were born in 1957 or later. You may also need a second dose. For females of childbearing age, rubella immunity should be determined. If there is no evidence of immunity, females who are not pregnant should be vaccinated. If there is no evidence of immunity, females who are pregnant should delay immunization until after pregnancy.  Pneumococcal 13-valent conjugate (PCV13) vaccine.** / Consult your health care  provider.  Pneumococcal polysaccharide (PPSV23) vaccine.** / 1 to 2 doses if you smoke cigarettes or if you have certain conditions.  Meningococcal vaccine.** / Consult your health care provider.  Hepatitis A vaccine.** / Consult your health care provider.  Hepatitis B vaccine.** / Consult your health care provider.  Haemophilus influenzae type b (Hib) vaccine.** / Consult your health care provider. Ages 31 years and over  Blood pressure check.** / Every year.  Lipid and cholesterol check.** / Every 5 years beginning at age 90 years.  Lung cancer screening. / Every year if you are aged 31-80 years and have a 30-pack-year history of smoking and currently smoke or have quit within the past 15 years. Yearly screening is stopped once you have quit smoking for at least 15 years or develop a health problem that would prevent you from having lung cancer treatment.  Clinical breast exam.** / Every year after age 87 years.  BRCA-related cancer risk assessment.** / For women who have family members with a BRCA-related cancer (breast, ovarian, tubal, or peritoneal cancers).  Mammogram.** / Every year beginning at age 18 years and continuing for as long as you are in good health. Consult with your health care provider.  Pap test.** / Every 3 years starting at age 7 years through age 9 or 80 years with 3 consecutive normal Pap tests. Testing can be stopped between 65 and 70 years with 3 consecutive normal Pap tests and no abnormal Pap or HPV tests in the past 10 years.  HPV screening.** / Every 3 years from ages 83 years through ages 60 or 67 years with a history of 3 consecutive normal Pap tests. Testing can be stopped between 65 and 70 years with 3 consecutive normal Pap tests and no abnormal Pap or HPV tests in the past 10 years.  Fecal occult blood test (FOBT) of stool. / Every year beginning at age 84 years and continuing until age 3 years. You may not need to do this test if you get a  colonoscopy every 10 years.  Flexible sigmoidoscopy or colonoscopy.** / Every 5 years for a flexible sigmoidoscopy or every 10 years for a colonoscopy beginning at age 71 years and continuing until age 39 years.  Hepatitis C blood test.** / For all people born from [birth year] through 1966 and any individual with known risks for hepatitis C.  Osteoporosis screening.** / A one-time screening for women ages 27 years and over and women at risk for fractures or osteoporosis.  Skin self-exam. / Monthly.  Influenza vaccine. / Every year.  Tetanus, diphtheria, and acellular pertussis (Tdap/Td) vaccine.** / 1 dose of Td every 10 years.  Varicella vaccine.** / Consult your health care provider.  Zoster vaccine.** / 1 dose for adults aged 13 years or older.  Pneumococcal 13-valent conjugate (PCV13) vaccine.** / Consult your health care provider.  Pneumococcal polysaccharide (PPSV23) vaccine.** / 1 dose for all adults aged 28 years and older.  Meningococcal vaccine.** / Consult your health care provider.  Hepatitis A vaccine.** / Consult your health care provider.  Hepatitis  B vaccine.** / Consult your health care provider.  Haemophilus influenzae type b (Hib) vaccine.** / Consult your health care provider. ** Family history and personal history of risk and conditions may change your health care provider's recommendations.   This information is not intended to replace advice given to you by your health care provider. Make sure you discuss any questions you have with your health care provider.   Document Released: 01/21/2002 Document Revised: 12/16/2014 Document Reviewed: 04/22/2011 Elsevier Interactive Patient Education [employer].

## 2016-05-09 LAB — URINALYSIS, ROUTINE W REFLEX MICROSCOPIC
BILIRUBIN URINE: NEGATIVE
HGB URINE DIPSTICK: NEGATIVE
Ketones, ur: NEGATIVE
Leukocytes, UA: NEGATIVE
Nitrite: NEGATIVE
Specific Gravity, Urine: 1.01 (ref 1.000–1.030)
TOTAL PROTEIN, URINE-UPE24: NEGATIVE
UROBILINOGEN UA: 0.2 (ref 0.0–1.0)
Urine Glucose: NEGATIVE
pH: 6 (ref 5.0–8.0)

## 2016-05-09 LAB — COMPREHENSIVE METABOLIC PANEL
ALT: 11 U/L (ref 0–35)
AST: 18 U/L (ref 0–37)
Albumin: 3.9 g/dL (ref 3.5–5.2)
Alkaline Phosphatase: 54 U/L (ref 39–117)
BILIRUBIN TOTAL: 0.6 mg/dL (ref 0.2–1.2)
BUN: 13 mg/dL (ref 6–23)
CALCIUM: 9 mg/dL (ref 8.4–10.5)
CHLORIDE: 106 meq/L (ref 96–112)
CO2: 29 meq/L (ref 19–32)
CREATININE: 0.65 mg/dL (ref 0.40–1.20)
GFR: 109.5 mL/min (ref >60.00)
GLUCOSE: 83 mg/dL (ref 70–99)
Potassium: 3.4 mEq/L — ABNORMAL LOW (ref 3.5–5.1)
SODIUM: 140 meq/L (ref 135–145)
Total Protein: 6.5 g/dL (ref 6.0–8.3)

## 2016-05-09 LAB — CBC
HCT: 39.3 % (ref 36.0–46.0)
Hemoglobin: 13 g/dL (ref 12.0–15.0)
MCHC: 33.2 g/dL (ref 30.0–36.0)
MCV: 93.3 fl (ref 78.0–100.0)
Platelets: 214 10*3/uL (ref 150.0–400.0)
RBC: 4.22 Mil/uL (ref 3.87–5.11)
RDW: 13.6 % (ref 11.5–15.5)
WBC: 9.2 10*3/uL (ref 4.0–10.5)

## 2016-05-10 ENCOUNTER — Other Ambulatory Visit (INDEPENDENT_AMBULATORY_CARE_PROVIDER_SITE_OTHER): Payer: Self-pay

## 2016-05-10 DIAGNOSIS — Z0000 Encounter for general adult medical examination without abnormal findings: Secondary | ICD-10-CM

## 2016-05-10 LAB — TSH: TSH: 0.64 u[IU]/mL (ref 0.35–4.50)

## 2016-05-10 LAB — LIPID PANEL
CHOL/HDL RATIO: 4
CHOLESTEROL: 161 mg/dL (ref 0–200)
HDL: 44.9 mg/dL (ref >39.00)
LDL CALC: 89 mg/dL (ref 0–99)
NonHDL: 116.03
Triglycerides: 133 mg/dL (ref 0.0–149.0)
VLDL: 26.6 mg/dL (ref 0.0–40.0)

## 2016-05-10 LAB — HEMOGLOBIN A1C: HEMOGLOBIN A1C: 5.4 % (ref 4.6–6.5)

## 2016-05-12 DIAGNOSIS — M25562 Pain in left knee: Secondary | ICD-10-CM

## 2016-05-12 DIAGNOSIS — J302 Other seasonal allergic rhinitis: Secondary | ICD-10-CM | POA: Insufficient documentation

## 2016-05-12 DIAGNOSIS — Z0000 Encounter for general adult medical examination without abnormal findings: Secondary | ICD-10-CM | POA: Insufficient documentation

## 2016-05-12 DIAGNOSIS — M25561 Pain in right knee: Secondary | ICD-10-CM

## 2016-05-12 DIAGNOSIS — M25552 Pain in left hip: Secondary | ICD-10-CM

## 2016-05-12 DIAGNOSIS — G8929 Other chronic pain: Secondary | ICD-10-CM | POA: Insufficient documentation

## 2016-05-12 DIAGNOSIS — M25551 Pain in right hip: Secondary | ICD-10-CM

## 2016-05-12 NOTE — Assessment & Plan Note (Signed)
Will begin Celebrex. Discussed labs for rheumatological cause of symptoms. Declines for now.

## 2016-05-12 NOTE — Assessment & Plan Note (Signed)
  Health Maintenance reviewed -- up-to-date Preventive schedule discussed and handout given in AVS. Will obtain fasting labs today.

## 2016-05-12 NOTE — Assessment & Plan Note (Signed)
Continue Flonase and Zyrtec. Will add-on Astelin BID. Follow-up if symptoms are not improving.

## 2016-05-13 ENCOUNTER — Telehealth: Payer: Self-pay | Admitting: *Deleted

## 2016-05-13 IMAGING — DX DG CHEST 2V
2 series · 2 of 2 positions shown · non-contrast
Comparison: Chest radiograph performed 06/17/2013

CLINICAL DATA: Progressively worsening cough for 1 month. Initial
encounter.

EXAM:
CHEST  2 VIEW

[chest pa]
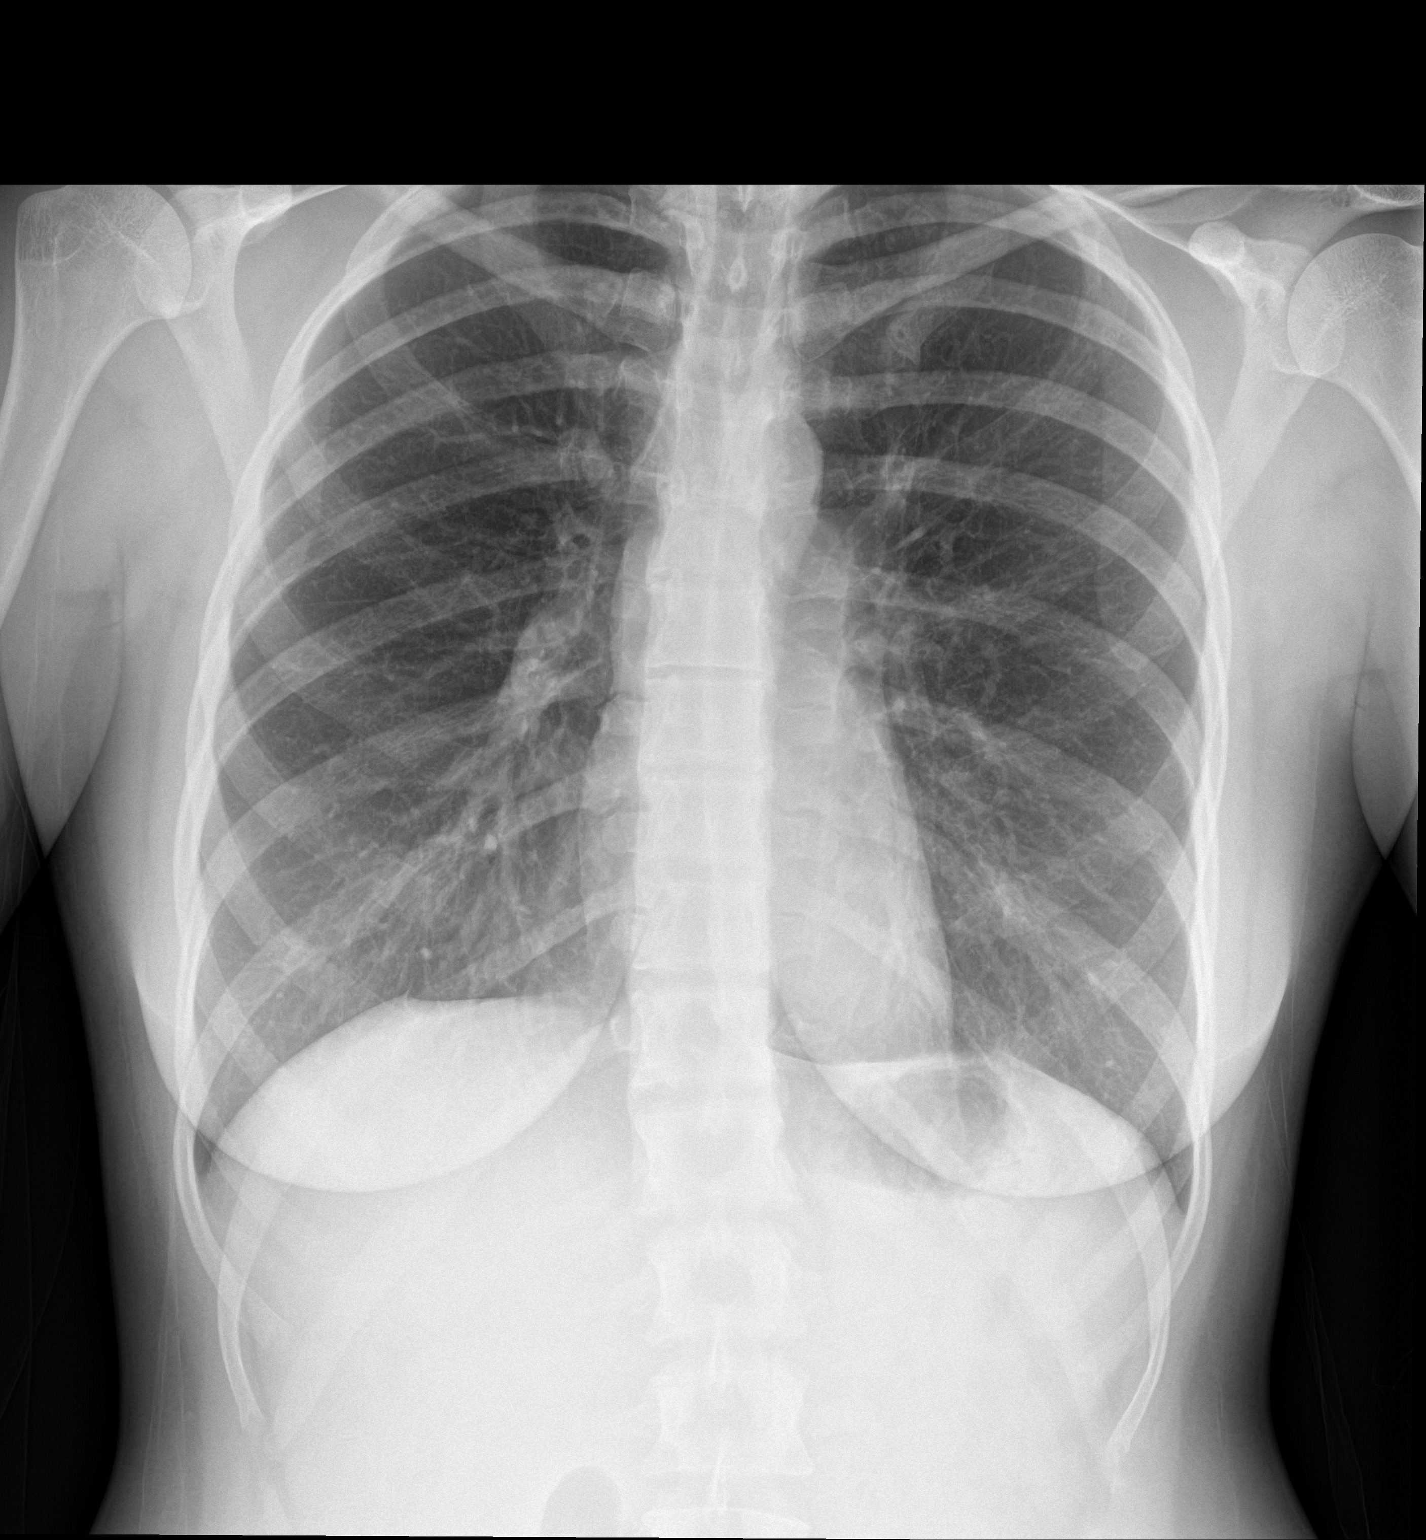

[chest lat]
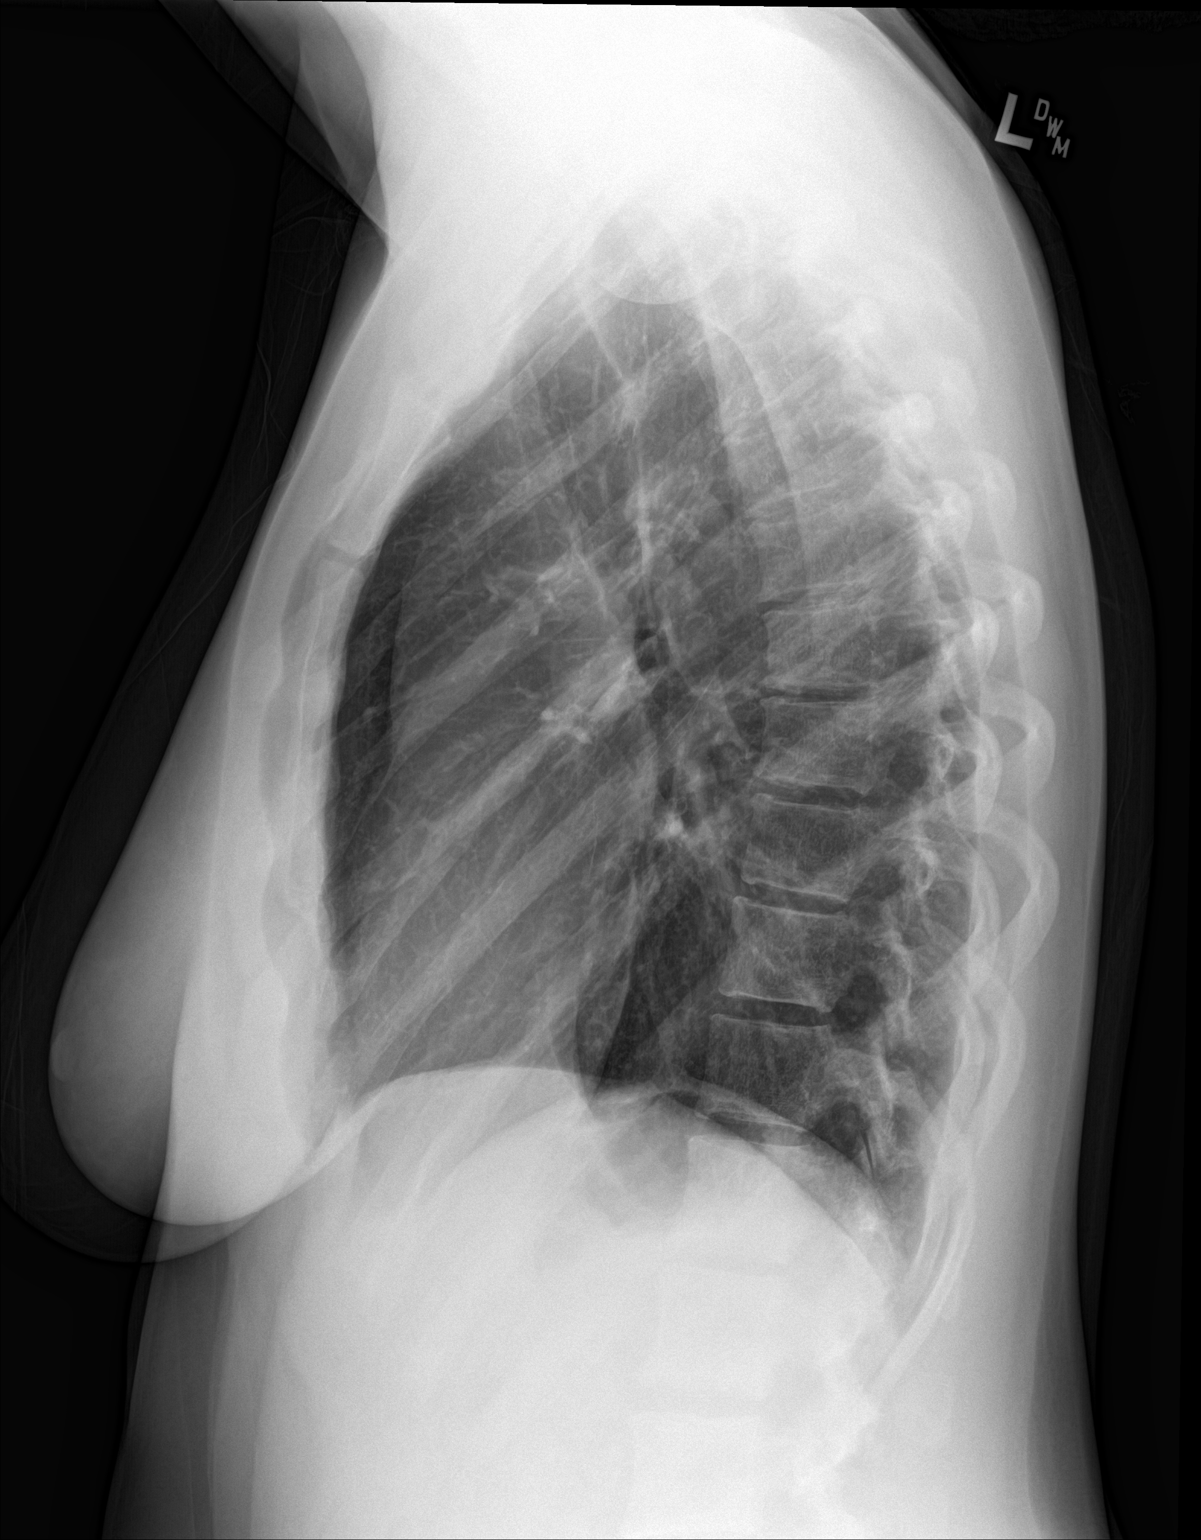

[2 of 2 positions shown; findings below may reference images not displayed]

FINDINGS: The lungs are well-aerated and clear. There is no evidence of focal
opacification, pleural effusion or pneumothorax.

The heart is normal in size; the mediastinal contour is within
normal limits. No acute osseous abnormalities are seen.
IMPRESSION: No acute cardiopulmonary process seen.

## 2016-05-13 MED ORDER — CIPROFLOXACIN HCL 500 MG PO TABS: 500.0000 mg | ORAL_TABLET | Freq: Two times a day (BID) | ORAL | Status: DC

## 2016-05-13 NOTE — Telephone Encounter (Signed)
Patient informed, understood & agreed; has some symptoms, per provider VO sent ABX to pharmacy/SLS

## 2016-05-13 NOTE — Telephone Encounter (Signed)
-----   Message from Waldon MerlWilliam C Martin, PA-C sent at 05/12/2016  9:12 AM EDT ----- Labs look great overall. Some bacteria noted in urine but no enzymes suggesting infection. Is she having any urgency, frequency, dysuria, hematuria? If any symptoms, recommend we start medication and have her return for urine culture. If no symptoms, nothing further is needed. Otherwise her labs look great.

## 2016-05-14 ENCOUNTER — Ambulatory Visit (HOSPITAL_COMMUNITY): Payer: Self-pay | Admitting: Psychiatry

## 2016-05-29 ENCOUNTER — Other Ambulatory Visit: Payer: Self-pay | Admitting: *Deleted

## 2016-05-29 MED ORDER — DULOXETINE HCL 30 MG PO CPEP: 60.0000 mg | ORAL_CAPSULE | Freq: Every day | ORAL | Status: DC

## 2016-05-29 NOTE — Telephone Encounter (Signed)
Rx sent to the pharmacy by e-script.//AB/CMA 

## 2016-06-21 ENCOUNTER — Ambulatory Visit (INDEPENDENT_AMBULATORY_CARE_PROVIDER_SITE_OTHER): Payer: 59 | Admitting: Physician Assistant

## 2016-06-21 ENCOUNTER — Encounter: Payer: Self-pay | Admitting: Physician Assistant

## 2016-06-21 VITALS — BP 108/70 | HR 89 | Temp 97.9°F | Resp 16 | Ht 66.0 in | Wt 134.4 lb

## 2016-06-21 DIAGNOSIS — F330 Major depressive disorder, recurrent, mild: Secondary | ICD-10-CM | POA: Diagnosis not present

## 2016-06-21 NOTE — Progress Notes (Signed)
Pre visit review using our clinic review tool, if applicable. No additional management support is needed unless otherwise documented below in the visit note/SLS  

## 2016-06-21 NOTE — Progress Notes (Signed)
Patient presents to clinic today c/o for follow-up of anxiety and depression. Is in need of refill of her Cymbalta. Is taking 60 mg daily as directed. Denies side effect of medication. Endorses significant improvement in her mood with medication. Denies SI/HI. Denies self-harming behavior (history of cutting).  Past Medical History  Diagnosis Date  . Depression   . Allergy   . GERD (gastroesophageal reflux disease)   . Chronic kidney disease 2002    kidney failure secondary to dehy./ibuprof  . Anxiety   . Genital herpes     Current Outpatient Prescriptions on File Prior to Visit  Medication Sig Dispense Refill  . albuterol (PROVENTIL HFA;VENTOLIN HFA) 108 (90 Base) MCG/ACT inhaler Inhale 1-2 puffs into the lungs every 4 (four) hours as needed for wheezing or shortness of breath.    . ARIPiprazole (ABILIFY) 5 MG tablet Take 5 mg by mouth daily.    Marland Kitchen azelastine (ASTELIN) 0.1 % nasal spray Place 1-2 sprays into both nostrils 2 (two) times daily. Use in each nostril as directed 30 mL 12  . celecoxib (CELEBREX) 200 MG capsule Take 1 capsule (200 mg total) by mouth daily. 30 capsule 1  . DULoxetine (CYMBALTA) 30 MG capsule Take 2 capsules (60 mg total) by mouth daily. 180 capsule 1  . fluticasone (FLONASE) 50 MCG/ACT nasal spray Place 2 sprays into both nostrils daily. 16 g 6  . levocetirizine (XYZAL) 5 MG tablet Take 1 tablet (5 mg total) by mouth every evening. 90 tablet 1  . [DISCONTINUED] esomeprazole (NEXIUM) 40 MG capsule Take 1 capsule (40 mg total) by mouth daily before breakfast. 30 capsule 0   No current facility-administered medications on file prior to visit.    Allergies  Allergen Reactions  . Sulfa Antibiotics     Unknown, childhood allergy  . Ceftin [Cefuroxime Axetil] Rash    Family History  Problem Relation Age of Onset  . Hypertension Mother   . Hyperlipidemia Father   . Heart disease Father   . Anxiety disorder Maternal Grandfather   . Anxiety disorder  Maternal Grandmother   . Anxiety disorder Paternal Grandfather   . Anxiety disorder Paternal Grandmother     Social History   Social History  . Marital Status: Divorced    Spouse Name: N/A  . Number of Children: 2  . 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 Smoking status: Former Smoker -- 0.30 packs/day  . Smokeless tobacco: Current User     Comment: 3 cigerettes daily for 3 months   . Alcohol Use: No     Comment: Pt was binge drinking 2-3 times a week-- Quit in July 2013  . Drug Use: Yes    Special: Marijuana  . Sexual Activity:    Partners: Male    Patent examiner Protection: None   Other Topics Concern  . None   Social History Narrative    Review of Systems - See HPI.  All other ROS are negative.  BP 108/70 mmHg  Pulse 89  Temp(Src) 97.9 F (36.6 C) (Oral)  Resp 16  Ht _0  (1.676 m)  Wt 134 lb 6 oz (60.952 kg)  BMI 21.70 kg/m2  SpO2 99%  Physical Exam  Constitutional: She is oriented to person, place, and time and well-developed, well-nourished, and in no distress.  HENT:  Head: Normocephalic and atraumatic.  Eyes: Conjunctivae are normal.  Cardiovascular: Normal rate, regular rhythm, normal  heart sounds and intact distal pulses.   Pulmonary/Chest: Effort normal and breath sounds normal. No respiratory distress. She has no wheezes. She has no rales. She exhibits no tenderness.  Neurological: She is alert and oriented to person, place, and time.  Skin: Skin is warm and dry. No rash noted.  Psychiatric: Affect normal.  Vitals reviewed.   Recent Results (from the past 2160 hour(s))  Comprehensive metabolic panel     Status: Abnormal   Collection Time: 04/11/16  2:06 PM  Result Value Ref Range   Sodium 136 135 - 145 mmol/L   Potassium 3.9 3.5 - 5.1 mmol/L   Chloride 105 101 - 111 mmol/L   CO2 24 22 - 32 mmol/L   Glucose, Bld 101 (H) 65 - 99 mg/dL   BUN 19 6 - 20 mg/dL   Creatinine, Ser 0.59 0.44 -  1.00 mg/dL   Calcium 9.1 8.9 - 10.3 mg/dL   Total Protein 6.9 6.5 - 8.1 g/dL   Albumin 4.1 3.5 - 5.0 g/dL   AST 19 15 - 41 U/L   ALT 13 (L) 14 - 54 U/L   Alkaline Phosphatase 55 38 - 126 U/L   Total Bilirubin 0.4 0.3 - 1.2 mg/dL   GFR calc non Af Amer >60 >60 mL/min   GFR calc Af Amer >60 >60 mL/min    Comment: (NOTE) The eGFR has been calculated using the CKD EPI equation. This calculation has not been validated in all clinical situations. eGFR's persistently <60 mL/min signify possible Chronic Kidney Disease.    Anion gap 7 5 - 15  Ethanol     Status: None   Collection Time: 04/11/16  2:06 PM  Result Value Ref Range   Alcohol, Ethyl (B) <5 <5 mg/dL    Comment:        LOWEST DETECTABLE LIMIT FOR SERUM ALCOHOL IS 5 mg/dL FOR MEDICAL PURPOSES ONLY   Salicylate level     Status: None   Collection Time: 04/11/16  2:06 PM  Result Value Ref Range   Salicylate Lvl <4.6 2.8 - 30.0 mg/dL  Acetaminophen level     Status: Abnormal   Collection Time: 04/11/16  2:06 PM  Result Value Ref Range   Acetaminophen (Tylenol), Serum <10 (L) 10 - 30 ug/mL    Comment:        THERAPEUTIC CONCENTRATIONS VARY SIGNIFICANTLY. A RANGE OF 10-30 ug/mL MAY BE AN EFFECTIVE CONCENTRATION FOR MANY PATIENTS. HOWEVER, SOME ARE BEST TREATED AT CONCENTRATIONS OUTSIDE THIS RANGE. ACETAMINOPHEN CONCENTRATIONS >150 ug/mL AT 4 HOURS AFTER INGESTION AND >50 ug/mL AT 12 HOURS AFTER INGESTION ARE OFTEN ASSOCIATED WITH TOXIC REACTIONS.   cbc     Status: None   Collection Time: 04/11/16  2:06 PM  Result Value Ref Range   WBC 8.5 4.0 - 10.5 K/uL   RBC 4.34 3.87 - 5.11 MIL/uL   Hemoglobin 13.5 12.0 - 15.0 g/dL   HCT 40.4 36.0 - 46.0 %   MCV 93.1 78.0 - 100.0 fL   MCH 31.1 26.0 - 34.0 pg   MCHC 33.4 30.0 - 36.0 g/dL   RDW 13.1 11.5 - 15.5 %   Platelets 174 150 - 400 K/uL  Rapid urine drug screen (hospital performed)     Status: Abnormal   Collection Time: 04/11/16  2:06 PM  Result Value Ref Range    Opiates NONE DETECTED NONE DETECTED   Cocaine NONE DETECTED NONE DETECTED   Benzodiazepines NONE DETECTED NONE DETECTED   Amphetamines NONE DETECTED NONE  DETECTED   Tetrahydrocannabinol POSITIVE (A) NONE DETECTED   Barbiturates NONE DETECTED NONE DETECTED    Comment:        DRUG SCREEN FOR MEDICAL PURPOSES ONLY.  IF CONFIRMATION IS NEEDED FOR ANY PURPOSE, NOTIFY LAB WITHIN 5 DAYS.        LOWEST DETECTABLE LIMITS FOR URINE DRUG SCREEN Drug Class       Cutoff (ng/mL) Amphetamine      1000 Barbiturate      200 Benzodiazepine   923 Tricyclics       300 Opiates          300 Cocaine          300 THC              50   I-Stat beta hCG blood, ED     Status: None   Collection Time: 04/11/16  2:14 PM  Result Value Ref Range   I-stat hCG, quantitative <5.0 <5 mIU/mL   Comment 3            Comment:   GEST. AGE      CONC.  (mIU/mL)   <=1 WEEK        5 - 50     2 WEEKS       50 - 500     3 WEEKS       100 - 10,000     4 WEEKS     1,000 - 30,000        FEMALE AND NON-PREGNANT FEMALE:     LESS THAN 5 mIU/mL   CBC     Status: None   Collection Time: 05/08/16  4:54 PM  Result Value Ref Range   WBC 9.2 4.0 - 10.5 K/uL   RBC 4.22 3.87 - 5.11 Mil/uL   Platelets 214.0 150.0 - 400.0 K/uL   Hemoglobin 13.0 12.0 - 15.0 g/dL   HCT 39.3 36.0 - 46.0 %   MCV 93.3 78.0 - 100.0 fl   MCHC 33.2 30.0 - 36.0 g/dL   RDW 13.6 11.5 - 15.5 %  Comp Met (CMET)     Status: Abnormal   Collection Time: 05/08/16  4:54 PM  Result Value Ref Range   Sodium 140 135 - 145 mEq/L   Potassium 3.4 (L) 3.5 - 5.1 mEq/L   Chloride 106 96 - 112 mEq/L   CO2 29 19 - 32 mEq/L   Glucose, Bld 83 70 - 99 mg/dL   BUN 13 6 - 23 mg/dL   Creatinine, Ser 0.65 0.40 - 1.20 mg/dL   Total Bilirubin 0.6 0.2 - 1.2 mg/dL   Alkaline Phosphatase 54 39 - 117 U/L   AST 18 0 - 37 U/L   ALT 11 0 - 35 U/L   Total Protein 6.5 6.0 - 8.3 g/dL   Albumin 3.9 3.5 - 5.2 g/dL   Calcium 9.0 8.4 - 10.5 mg/dL   GFR 109.50 >60.00 mL/min    Urinalysis, Routine w reflex microscopic (not at [facility])     Status: Abnormal   Collection Time: 05/08/16  4:54 PM  Result Value Ref Range   Color, Urine YELLOW Yellow;Lt. Yellow   APPearance CLEAR Clear   Specific Gravity, Urine 1.010 1.000-1.030   pH 6.0 5.0 - 8.0   Total Protein, Urine NEGATIVE Negative   Urine Glucose NEGATIVE Negative   Ketones, ur NEGATIVE Negative   Bilirubin Urine NEGATIVE Negative   Hgb urine dipstick NEGATIVE Negative   Urobilinogen, UA 0.2 0.0 - 1.0  Leukocytes, UA NEGATIVE Negative   Nitrite NEGATIVE Negative   WBC, UA 3-6/hpf (A) 0-2/hpf   RBC / HPF 0-2/hpf 0-2/hpf   Squamous Epithelial / LPF Rare(0-4/hpf) Rare(0-4/hpf)   Renal Epithel, UA Rare(0-4/hpf) (A) None   Bacteria, UA Many(>50/hpf) (A) None  Hemoglobin A1c     Status: None   Collection Time: 05/10/16  4:14 PM  Result Value Ref Range   Hgb A1c MFr Bld 5.4 4.6 - 6.5 %    Comment: Glycemic Control Guidelines for People with Diabetes:Non Diabetic:  <6%Goal of Therapy: <7%Additional Action Suggested:  >8%   TSH     Status: None   Collection Time: 05/10/16  4:14 PM  Result Value Ref Range   TSH 0.64 0.35 - 4.50 uIU/mL  Lipid panel     Status: None   Collection Time: 05/10/16  4:14 PM  Result Value Ref Range   Cholesterol 161 0 - 200 mg/dL    Comment: ATP III Classification       Desirable:  < 200 mg/dL               Borderline High:  200 - 239 mg/dL          High:  > = 240 mg/dL   Triglycerides 133.0 0.0 - 149.0 mg/dL    Comment: Normal:  <150 mg/dLBorderline High:  150 - 199 mg/dL   HDL 44.90 >39.00 mg/dL   VLDL 26.6 0.0 - 40.0 mg/dL   LDL Cholesterol 89 0 - 99 mg/dL   Total CHOL/HDL Ratio 4     Comment:                Men          Women1/2 Average Risk     3.4          3.3Average Risk          5.0          4.42X Average Risk          9.6          7.13X Average Risk          15.0          11.0                       NonHDL 116.03     Comment: NOTE:  Non-HDL goal should be 30 mg/dL higher  than patient's LDL goal (i.e. LDL goal of < 70 mg/dL, would have non-HDL goal of < 100 mg/dL)    Assessment/Plan: Depression, major, recurrent, mild Stable. No alarm signs/symptoms. Is upbeat today. Will continue Cymbalta as directed. FU 6 months.     Leeanne Rio, PA-C

## 2016-06-21 NOTE — Assessment & Plan Note (Signed)
Stable. No alarm signs/symptoms. Is upbeat today. Will continue Cymbalta as directed. FU 6 months.

## 2016-06-21 NOTE — Patient Instructions (Signed)
I am glad you are doing well. Please continue the medications as directed. Your prescription is ready for pickup at your [facility] - we have called to verify this for you.  Follow-up in 6 months.

## 2016-07-05 ENCOUNTER — Ambulatory Visit: Payer: 59 | Admitting: Physician Assistant

## 2016-07-05 ENCOUNTER — Telehealth: Payer: Self-pay | Admitting: Physician Assistant

## 2016-07-10 NOTE — Telephone Encounter (Signed)
No charge for 1st no-show 

## 2016-07-10 NOTE — Telephone Encounter (Signed)
Pt was no show 07/05/16 for f/u appt, pt has not rescheduled, 1st no show, charge or no charge?

## 2016-07-15 ENCOUNTER — Ambulatory Visit (INDEPENDENT_AMBULATORY_CARE_PROVIDER_SITE_OTHER): Payer: 59 | Admitting: Physician Assistant

## 2016-07-15 ENCOUNTER — Encounter: Payer: Self-pay | Admitting: Physician Assistant

## 2016-07-15 DIAGNOSIS — F4323 Adjustment disorder with mixed anxiety and depressed mood: Secondary | ICD-10-CM

## 2016-07-15 MED ORDER — ARIPIPRAZOLE 5 MG PO TABS: 5.0000 mg | ORAL_TABLET | Freq: Every day | ORAL | 1 refills | Status: DC

## 2016-07-15 NOTE — Addendum Note (Signed)
Addended by: Marcelline MatesMARTIN, [place] Coldren on: 07/15/2016 01:56 PM   Modules accepted: Orders

## 2016-07-15 NOTE — Assessment & Plan Note (Signed)
Recent exacerbation. Has resolved now that work situations have changed. Will continue current medications. FU with therapist as scheduled. FMLA completed. FU 6 months.

## 2016-07-15 NOTE — Progress Notes (Signed)
Patient presents to clinic today following up regarding major depression and anxiety. Was last seen 3 weeks ago and doing very well. Patient was instructed to continue current medication regimen at that time. Patient endorses about a week and a half ago having a flareup of acute anxiety, including multiple panic attacks. Denies worsening depression or anhedonia during this time. Patient states she missed 4 days of work due to his significant anxiety. States since that time, she was switched back to first shift. Has been getting good rest and is eating better, which have improved her mood. Is also getting more time with her family, which is helped significantly. Patient with history of self mutilating behavior. Denies recurrence of that behavior during this flare up. Denies suicidal thought or ideation. Patient has an interview for a new day shift position later today. Is very hopeful that she will get this shot.  Past Medical History:  Diagnosis Date  . Allergy   . Anxiety   . Chronic kidney disease 2002   kidney failure secondary to dehy./ibuprof  . Depression   . Genital herpes   . GERD (gastroesophageal reflux disease)     Current Outpatient Prescriptions on File Prior to Visit  Medication Sig Dispense Refill  . albuterol (PROVENTIL HFA;VENTOLIN HFA) 108 (90 Base) MCG/ACT inhaler Inhale 1-2 puffs into the lungs every 4 (four) hours as needed for wheezing or shortness of breath.    . ARIPiprazole (ABILIFY) 5 MG tablet Take 5 mg by mouth daily.    Marland Kitchen azelastine (ASTELIN) 0.1 % nasal spray Place 1-2 sprays into both nostrils 2 (two) times daily. Use in each nostril as directed 30 mL 12  . celecoxib (CELEBREX) 200 MG capsule Take 1 capsule (200 mg total) by mouth daily. 30 capsule 1  . DULoxetine (CYMBALTA) 30 MG capsule Take 2 capsules (60 mg total) by mouth daily. 180 capsule 1  . fluticasone (FLONASE) 50 MCG/ACT nasal spray Place 2 sprays into both nostrils daily. 16 g 6  . levocetirizine  (XYZAL) 5 MG tablet Take 1 tablet (5 mg total) by mouth every evening. 90 tablet 1  . [DISCONTINUED] esomeprazole (NEXIUM) 40 MG capsule Take 1 capsule (40 mg total) by mouth daily before breakfast. 30 capsule 0   No current facility-administered medications on file prior to visit.     Allergies  Allergen Reactions  . Sulfa Antibiotics     Unknown, childhood allergy  . Ceftin [Cefuroxime Axetil] Rash    Family History  Problem Relation Age of Onset  . Hypertension Mother   . Hyperlipidemia Father   . Heart disease Father   . Anxiety disorder Maternal Grandfather   . Anxiety disorder Maternal Grandmother   . Anxiety disorder Paternal Grandfather   . Anxiety disorder Paternal Grandmother     Social History   Social History  . Marital status: Divorced    Spouse name: N/A  . Number of children: 2  . 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 Smoking status: Former Smoker    Packs/day: 0.30  . Smokeless tobacco: Current User     Comment: 3 cigerettes daily for 3 months   . Alcohol use No     Comment: Pt was binge drinking 2-3 times a week-- Quit in July 2013  . Drug use:     Types: Marijuana  . Sexual activity: Yes    Partners: Male  Birth control/ protection: None   Other Topics Concern  . None   Social History Narrative  . None    Review of Systems - See HPI.  All other ROS are negative.  BP 102/66 (BP Location: Right Arm, Patient Position: Sitting, Cuff Size: Normal)   Pulse 90   Temp 97.9 F (36.6 C) (Oral)   Resp 16   Ht _0  (1.676 m)   Wt 138 lb 2 oz (62.7 kg)   SpO2 99%   BMI 22.29 kg/m   Physical Exam  Constitutional: She is oriented to person, place, and time and well-developed, well-nourished, and in no distress.  HENT:  Head: Normocephalic and atraumatic.  Eyes: Conjunctivae are normal.  Cardiovascular: Normal rate, regular rhythm, normal heart sounds and intact  distal pulses.   Pulmonary/Chest: Effort normal and breath sounds normal.  Neurological: She is alert and oriented to person, place, and time.  Skin: Skin is warm and dry. No rash noted.  Psychiatric: Affect normal.  Vitals reviewed.   Recent Results (from the past 2160 hour(s))  CBC     Status: None   Collection Time: 05/08/16  4:54 PM  Result Value Ref Range   WBC 9.2 4.0 - 10.5 K/uL   RBC 4.22 3.87 - 5.11 Mil/uL   Platelets 214.0 150.0 - 400.0 K/uL   Hemoglobin 13.0 12.0 - 15.0 g/dL   HCT 39.3 36.0 - 46.0 %   MCV 93.3 78.0 - 100.0 fl   MCHC 33.2 30.0 - 36.0 g/dL   RDW 13.6 11.5 - 15.5 %  Comp Met (CMET)     Status: Abnormal   Collection Time: 05/08/16  4:54 PM  Result Value Ref Range   Sodium 140 135 - 145 mEq/L   Potassium 3.4 (L) 3.5 - 5.1 mEq/L   Chloride 106 96 - 112 mEq/L   CO2 29 19 - 32 mEq/L   Glucose, Bld 83 70 - 99 mg/dL   BUN 13 6 - 23 mg/dL   Creatinine, Ser 0.65 0.40 - 1.20 mg/dL   Total Bilirubin 0.6 0.2 - 1.2 mg/dL   Alkaline Phosphatase 54 39 - 117 U/L   AST 18 0 - 37 U/L   ALT 11 0 - 35 U/L   Total Protein 6.5 6.0 - 8.3 g/dL   Albumin 3.9 3.5 - 5.2 g/dL   Calcium 9.0 8.4 - 10.5 mg/dL   GFR 109.50 >60.00 mL/min  Urinalysis, Routine w reflex microscopic (not at [facility])     Status: Abnormal   Collection Time: 05/08/16  4:54 PM  Result Value Ref Range   Color, Urine YELLOW Yellow;Lt. Yellow   APPearance CLEAR Clear   Specific Gravity, Urine 1.010 1.000 - 1.030   pH 6.0 5.0 - 8.0   Total Protein, Urine NEGATIVE Negative   Urine Glucose NEGATIVE Negative   Ketones, ur NEGATIVE Negative   Bilirubin Urine NEGATIVE Negative   Hgb urine dipstick NEGATIVE Negative   Urobilinogen, UA 0.2 0.0 - 1.0   Leukocytes, UA NEGATIVE Negative   Nitrite NEGATIVE Negative   WBC, UA 3-6/hpf (A) 0-2/hpf   RBC / HPF 0-2/hpf 0-2/hpf   Squamous Epithelial / LPF Rare(0-4/hpf) Rare(0-4/hpf)   Renal Epithel, UA Rare(0-4/hpf) (A) None   Bacteria, UA Many(>50/hpf) (A) None    Hemoglobin A1c     Status: None   Collection Time: 05/10/16  4:14 PM  Result Value Ref Range   Hgb A1c MFr Bld 5.4 4.6 - 6.5 %    Comment: Glycemic Control  Guidelines for People with Diabetes:Non Diabetic:  <6%Goal of Therapy: <7%Additional Action Suggested:  >8%   TSH     Status: None   Collection Time: 05/10/16  4:14 PM  Result Value Ref Range   TSH 0.64 0.35 - 4.50 uIU/mL  Lipid panel     Status: None   Collection Time: 05/10/16  4:14 PM  Result Value Ref Range   Cholesterol 161 0 - 200 mg/dL    Comment: ATP III Classification       Desirable:  < 200 mg/dL               Borderline High:  200 - 239 mg/dL          High:  > = 240 mg/dL   Triglycerides 133.0 0.0 - 149.0 mg/dL    Comment: Normal:  <150 mg/dLBorderline High:  150 - 199 mg/dL   HDL 44.90 >39.00 mg/dL   VLDL 26.6 0.0 - 40.0 mg/dL   LDL Cholesterol 89 0 - 99 mg/dL   Total CHOL/HDL Ratio 4     Comment:                Men          Women1/2 Average Risk     3.4          3.3Average Risk          5.0          4.42X Average Risk          9.6          7.13X Average Risk          15.0          11.0                       NonHDL 116.03     Comment: NOTE:  Non-HDL goal should be 30 mg/dL higher than patient's LDL goal (i.e. LDL goal of < 70 mg/dL, would have non-HDL goal of < 100 mg/dL)   Assessment/Plan: Adjustment disorder with mixed anxiety and depressed mood Recent exacerbation. Has resolved now that work situations have changed. Will continue current medications. FU with therapist as scheduled. FMLA completed. FU 6 months.    Leeanne Rio, PA-C

## 2016-07-18 ENCOUNTER — Other Ambulatory Visit: Payer: Self-pay | Admitting: Physician Assistant

## 2016-07-18 DIAGNOSIS — M25559 Pain in unspecified hip: Secondary | ICD-10-CM

## 2016-07-18 DIAGNOSIS — M25569 Pain in unspecified knee: Principal | ICD-10-CM

## 2016-08-28 ENCOUNTER — Encounter: Payer: Self-pay | Admitting: Physician Assistant

## 2016-08-28 DIAGNOSIS — F330 Major depressive disorder, recurrent, mild: Secondary | ICD-10-CM

## 2016-09-02 ENCOUNTER — Ambulatory Visit (INDEPENDENT_AMBULATORY_CARE_PROVIDER_SITE_OTHER): Payer: 59 | Admitting: Family Medicine

## 2016-09-02 ENCOUNTER — Encounter: Payer: Self-pay | Admitting: Family Medicine

## 2016-09-02 VITALS — BP 94/70 | HR 105 | Temp 98.3°F | Ht 66.0 in | Wt 140.8 lb

## 2016-09-02 DIAGNOSIS — J189 Pneumonia, unspecified organism: Secondary | ICD-10-CM | POA: Diagnosis not present

## 2016-09-02 DIAGNOSIS — J9801 Acute bronchospasm: Secondary | ICD-10-CM | POA: Diagnosis not present

## 2016-09-02 MED ORDER — PREDNISONE 50 MG PO TABS: ORAL_TABLET | ORAL | 0 refills | Status: DC

## 2016-09-02 MED ORDER — IPRATROPIUM-ALBUTEROL 0.5-2.5 (3) MG/3ML IN SOLN: 3.0000 mL | Freq: Once | RESPIRATORY_TRACT | Status: DC

## 2016-09-02 MED ORDER — AZITHROMYCIN 250 MG PO TABS: ORAL_TABLET | ORAL | 0 refills | Status: DC

## 2016-09-02 MED ORDER — IPRATROPIUM-ALBUTEROL 0.5-2.5 (3) MG/3ML IN SOLN
3.0000 mL | Freq: Four times a day (QID) | RESPIRATORY_TRACT | Status: DC
  Administered 2016-09-02: 3 mL via RESPIRATORY_TRACT

## 2016-09-02 NOTE — Addendum Note (Signed)
Addended by: Verdie ShireBAYNES, ANGELA M on: 09/02/2016 04:44 PM   Modules accepted: Orders

## 2016-09-02 NOTE — Progress Notes (Signed)
Pre visit review using our clinic review tool, if applicable. No additional management support is needed unless otherwise documented below in the visit note. 

## 2016-09-02 NOTE — Progress Notes (Addendum)
Chief Complaint  Patient presents with  . Cough    started last Wed-product-clear)-along with wheezing    Geoffry ParadiseJennifer M Remick here for URI complaints.  Duration: 2 weeks ago, 5 days ago got worse Associated symptoms: productive cough, runny nose, stuffy nose, and wheezing Denies: sore throat and fevers  Treatment to date: OTC cough/cold product of patient's choice PRN, qvar BID, and fluids and rest Sick contacts: Yes- father She does smoke, however has not been lately No hx of COPD, but does have hx of bronchospasm with illness.  ROS:  Const: Denies fevers HEENT: As noted in HPI Lungs: +SOB, +cough  Past Medical History:  Diagnosis Date  . Allergy   . Anxiety   . Chronic kidney disease 2002   kidney failure secondary to dehy./ibuprof  . Depression   . Genital herpes   . GERD (gastroesophageal reflux disease)    Family History  Problem Relation Age of Onset  . Hypertension Mother   . Hyperlipidemia Father   . Heart disease Father   . Anxiety disorder Maternal Grandfather   . Anxiety disorder Maternal Grandmother   . Anxiety disorder Paternal Grandfather   . Anxiety disorder Paternal Grandmother     BP 94/70 (BP Location: Left Arm, Patient Position: Sitting, Cuff Size: Normal)   Pulse (!) 105   Temp 98.3 F (36.8 C) (Oral)   Ht 5\' 6"  (1.676 m)   Wt 140 lb 12.8 oz (63.9 kg)   SpO2 98%   BMI 22.73 kg/m  General: Awake, alert, appears stated age HEENT: AT, [place], ears patent b/l and TM's with myringosclerosis b/l, nares patent w/o discharge, pharynx pink and without exudates, MMM Neck: No masses or asymmetry Heart: RRR, no murmurs, no bruits Lungs: +Transmitted upper airway noises, +expiratory wheezes diffusely, She was using some accessory muscle use after some of her coughing fits that did persist, +conversational dyspnea Psych: Age appropriate judgment and insight, normal mood and affect  Walking pneumonia - Plan: predniSONE (DELTASONE) 50 MG tablet, azithromycin  (ZITHROMAX) 250 MG tablet  Orders as above. Continue with Xyzal, Flonase, and start using Albuterol every 6 hours scheduled. Hold off on Qvar while she is taking PO steroids. Breathing tx today- subjectively and objectively improved. Still had coarse breath sounds, but moving air better. F/u in 1 week if symptoms worsen or fail to improve. Pt voiced understanding and agreement to the plan.  Jilda Rocheicholas Paul FlorenceWendling, DO 09/02/16 1:47 PM

## 2016-09-02 NOTE — Patient Instructions (Addendum)
Use albuterol scheduled every 6 hours. Use nasal spray to help things with nasal secretions and congestion.  Honey may also help with cough.

## 2016-09-02 NOTE — Addendum Note (Signed)
Addended by: Vergie LivingBECKETT, Kayda Allers S on: 09/02/2016 04:49 PM   Modules accepted: Orders

## 2016-09-03 NOTE — Addendum Note (Signed)
Addended by: Arnette NorrisPAYNE, Kesley Mullens P on: 09/03/2016 12:06 PM   Modules accepted: Orders

## 2016-09-04 ENCOUNTER — Other Ambulatory Visit: Payer: Self-pay | Admitting: Physician Assistant

## 2016-09-04 NOTE — Telephone Encounter (Signed)
Rx request to pharmacy/[facility]  

## 2016-09-11 ENCOUNTER — Ambulatory Visit (INDEPENDENT_AMBULATORY_CARE_PROVIDER_SITE_OTHER): Payer: Self-pay | Admitting: Psychiatry

## 2016-09-11 ENCOUNTER — Encounter (HOSPITAL_COMMUNITY): Payer: Self-pay | Admitting: Psychiatry

## 2016-09-11 VITALS — BP 112/66 | HR 89 | Ht 66.0 in | Wt 142.0 lb

## 2016-09-11 DIAGNOSIS — F1210 Cannabis abuse, uncomplicated: Secondary | ICD-10-CM

## 2016-09-11 DIAGNOSIS — F1099 Alcohol use, unspecified with unspecified alcohol-induced disorder: Secondary | ICD-10-CM

## 2016-09-11 DIAGNOSIS — F4323 Adjustment disorder with mixed anxiety and depressed mood: Secondary | ICD-10-CM

## 2016-09-11 DIAGNOSIS — F330 Major depressive disorder, recurrent, mild: Secondary | ICD-10-CM

## 2016-09-11 DIAGNOSIS — F411 Generalized anxiety disorder: Secondary | ICD-10-CM

## 2016-09-11 DIAGNOSIS — IMO0002 Reserved for concepts with insufficient information to code with codable children: Secondary | ICD-10-CM

## 2016-09-11 MED ORDER — LAMOTRIGINE 25 MG PO TABS: 25.0000 mg | ORAL_TABLET | Freq: Every day | ORAL | 0 refills | Status: DC
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
## 2016-09-17 ENCOUNTER — Telehealth (HOSPITAL_COMMUNITY): Payer: Self-pay | Admitting: Licensed Clinical Social Worker

## 2016-10-01 ENCOUNTER — Ambulatory Visit (INDEPENDENT_AMBULATORY_CARE_PROVIDER_SITE_OTHER): Payer: PRIVATE HEALTH INSURANCE | Admitting: Psychiatry

## 2016-10-01 ENCOUNTER — Encounter (HOSPITAL_COMMUNITY): Payer: Self-pay | Admitting: Psychiatry

## 2016-10-01 VITALS — BP 114/62 | HR 86 | Resp 14 | Ht 66.0 in | Wt 145.0 lb

## 2016-10-01 DIAGNOSIS — F1210 Cannabis abuse, uncomplicated: Secondary | ICD-10-CM | POA: Diagnosis not present

## 2016-10-01 DIAGNOSIS — Z818 Family history of other mental and behavioral disorders: Secondary | ICD-10-CM

## 2016-10-01 DIAGNOSIS — F1099 Alcohol use, unspecified with unspecified alcohol-induced disorder: Secondary | ICD-10-CM | POA: Diagnosis not present

## 2016-10-01 DIAGNOSIS — Z87891 Personal history of nicotine dependence: Secondary | ICD-10-CM

## 2016-10-01 DIAGNOSIS — F330 Major depressive disorder, recurrent, mild: Secondary | ICD-10-CM | POA: Diagnosis not present

## 2016-10-01 DIAGNOSIS — Z79899 Other long term (current) drug therapy: Secondary | ICD-10-CM

## 2016-10-01 DIAGNOSIS — F411 Generalized anxiety disorder: Secondary | ICD-10-CM

## 2016-10-01 DIAGNOSIS — Z8249 Family history of ischemic heart disease and other diseases of the circulatory system: Secondary | ICD-10-CM

## 2016-10-01 DIAGNOSIS — IMO0002 Reserved for concepts with insufficient information to code with codable children: Secondary | ICD-10-CM

## 2016-10-01 MED ORDER — LAMOTRIGINE 25 MG PO TABS: 50.0000 mg | ORAL_TABLET | Freq: Every day | ORAL | 0 refills | Status: DC
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
## 2016-10-08 ENCOUNTER — Ambulatory Visit (INDEPENDENT_AMBULATORY_CARE_PROVIDER_SITE_OTHER): Payer: PRIVATE HEALTH INSURANCE | Admitting: Licensed Clinical Social Worker

## 2016-10-08 DIAGNOSIS — F411 Generalized anxiety disorder: Secondary | ICD-10-CM

## 2016-10-08 DIAGNOSIS — F1099 Alcohol use, unspecified with unspecified alcohol-induced disorder: Secondary | ICD-10-CM | POA: Diagnosis not present

## 2016-10-08 DIAGNOSIS — F330 Major depressive disorder, recurrent, mild: Secondary | ICD-10-CM

## 2016-10-08 DIAGNOSIS — F1210 Cannabis abuse, uncomplicated: Secondary | ICD-10-CM | POA: Diagnosis not present

## 2016-10-08 DIAGNOSIS — IMO0002 Reserved for concepts with insufficient information to code with codable children: Secondary | ICD-10-CM
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
## 2016-10-29 ENCOUNTER — Ambulatory Visit (INDEPENDENT_AMBULATORY_CARE_PROVIDER_SITE_OTHER): Payer: 59 | Admitting: Medical

## 2016-10-29 ENCOUNTER — Encounter: Payer: Self-pay | Admitting: Medical

## 2016-10-29 ENCOUNTER — Ambulatory Visit (HOSPITAL_BASED_OUTPATIENT_CLINIC_OR_DEPARTMENT_OTHER)
Admission: RE | Admit: 2016-10-29 | Discharge: 2016-10-29 | Disposition: A | Discharge status: Home / Self Care | Payer: 59 | Source: Ambulatory Visit | Attending: Medical | Admitting: Medical

## 2016-10-29 VITALS — BP 104/70 | HR 82 | Temp 97.8°F | Ht 66.0 in | Wt 140.0 lb

## 2016-10-29 DIAGNOSIS — R0989 Other specified symptoms and signs involving the circulatory and respiratory systems: Secondary | ICD-10-CM | POA: Diagnosis present

## 2016-10-29 DIAGNOSIS — J209 Acute bronchitis, unspecified: Secondary | ICD-10-CM

## 2016-10-29 DIAGNOSIS — R062 Wheezing: Secondary | ICD-10-CM

## 2016-10-29 DIAGNOSIS — R05 Cough: Secondary | ICD-10-CM | POA: Insufficient documentation

## 2016-10-29 LAB — CBC WITH DIFFERENTIAL/PLATELET
BASOS PCT: 0.2 % (ref 0.0–3.0)
Basophils Absolute: 0 10*3/uL (ref 0.0–0.1)
EOS PCT: 2 % (ref 0.0–5.0)
Eosinophils Absolute: 0.3 10*3/uL (ref 0.0–0.7)
HCT: 41.8 % (ref 36.0–46.0)
HEMOGLOBIN: 14 g/dL (ref 12.0–15.0)
LYMPHS ABS: 3.6 10*3/uL (ref 0.7–4.0)
Lymphocytes Relative: 22.6 % (ref 12.0–46.0)
MCHC: 33.5 g/dL (ref 30.0–36.0)
MCV: 92.9 fl (ref 78.0–100.0)
MONO ABS: 1.1 10*3/uL — AB (ref 0.1–1.0)
MONOS PCT: 6.7 % (ref 3.0–12.0)
NEUTROS PCT: 68.5 % (ref 43.0–77.0)
Neutro Abs: 11 10*3/uL — ABNORMAL HIGH (ref 1.4–7.7)
Platelets: 243 10*3/uL (ref 150.0–400.0)
RBC: 4.5 Mil/uL (ref 3.87–5.11)
RDW: 14.1 % (ref 11.5–15.5)
WBC: 16 10*3/uL — ABNORMAL HIGH (ref 4.0–10.5)

## 2016-10-29 MED ORDER — PREDNISONE 10 MG PO TABS
ORAL_TABLET | ORAL | 0 refills | Status: DC
Start: 2016-10-29 — End: 2017-03-27

## 2016-10-29 MED ORDER — METHYLPREDNISOLONE ACETATE 80 MG/ML IJ SUSP
80.0000 mg | Freq: Once | INTRAMUSCULAR | Status: AC
  Administered 2016-10-29: 80 mg via INTRAMUSCULAR

## 2016-10-29 MED ORDER — AZITHROMYCIN 250 MG PO TABS: ORAL_TABLET | ORAL | 0 refills | Status: DC

## 2016-10-29 MED ORDER — BENZONATATE 100 MG PO CAPS: 100.0000 mg | ORAL_CAPSULE | Freq: Three times a day (TID) | ORAL | 0 refills | Status: DC | PRN

## 2016-10-29 NOTE — Progress Notes (Signed)
Pre visit review using our clinic review tool, if applicable. No additional management support is needed unless otherwise documented below in the visit note. 

## 2016-10-29 NOTE — Progress Notes (Signed)
Subjective:    Patient ID: Carolyn Rasmussen, female    DOB: 1981/07/17, 35 y.o.   MRN: [id number]  HPI  Pt in states she most recently has been ill. She gives me update end of September. She was diagnosed with walking pneumonia. Pt was given azithromycin back then. Pt states she got better briefly for about 2 weeks. She was also given prednisone as well by Dr Carmelia RollerWendling.. After she saw Dr. Carmelia RollerWendling she had some persistent  wheezing and given steroid injection by UC. Pt has neb machine at home. Cxr at urgent care in pastt  per pt did not show pneumonia.  Pt states over past month or so would cough and occasoinaly bring up mucous but then last two weeks feeling more chest congested and bringing up more mucous. Last 3 days she started to wheeze a lot.  [facility] but [facility] had outstanding bill so was not seen.  She then called EMS. They gave her duoneb treatment. She got better and pt was not transported to ED.  Pt has mirena iud.  Pt at 35 yo would wheeze after illness. But as child did not have asthma per her report. Pt is marijuana smoker. 4 joints a day.   Review of Systems  Constitutional: Negative for chills, fatigue and fever.       Maybe cold sweats last night.  HENT: Positive for congestion and sinus pressure.   Respiratory: Positive for cough and wheezing. Negative for shortness of breath.   Cardiovascular: Negative for palpitations.  Gastrointestinal: Negative for abdominal pain.  Musculoskeletal: Negative for back pain and joint swelling.  Skin: Negative for rash.  Neurological: Negative for dizziness, seizures and headaches.  Hematological: Negative for adenopathy. Does not bruise/bleed easily.  Psychiatric/Behavioral: Negative for behavioral problems and confusion.    Past Medical History:  Diagnosis Date  . Allergy   . Anxiety   . Chronic kidney disease 2002   kidney failure secondary to dehy./ibuprof  . Depression   . Genital herpes   . GERD  (gastroesophageal reflux disease)      Social History   Social History  . Marital status: Divorced    Spouse name: N/A  . Number of children: 2  . Years of education: N/A   Occupational History  . ER EMT [employer]   Social History Main Topics  . Smoking status: Former Smoker    Packs/day: 0.30  . Smokeless tobacco: Current User     Comment: 3 cigerettes daily for 3 months   . Alcohol use No     Comment: last drink was 1 month ago  . Drug use:     Frequency: 7.0 times per week    Types: Marijuana  . Sexual activity: Yes    Partners: Male    Birth control/ protection: IUD   Other Topics Concern  . Not on file   Social History Narrative  . No narrative on file    Past Surgical History:  Procedure Laterality Date  . KIDNEY STONE SURGERY    . tubes in ears     x4  . typanoplasty      Family History  Problem Relation Age of Onset  . Hypertension Mother   . Hyperlipidemia Father   . Heart disease Father   . Anxiety disorder Maternal Grandfather   . Anxiety disorder Maternal Grandmother   . Anxiety disorder Paternal Grandfather   . Anxiety disorder Paternal Grandmother  Allergies  Allergen Reactions  . Ceftin [Cefuroxime Axetil] Rash  . Sulfa Antibiotics     Unknown, childhood allergy    Current Outpatient Prescriptions on File Prior to Visit  Medication Sig Dispense Refill  . azelastine (ASTELIN) 0.1 % nasal spray Place 1-2 sprays into both nostrils 2 (two) times daily. Use in each nostril as directed 30 mL 12  . beclomethasone (QVAR) 40 MCG/ACT inhaler Inhale 1 puff into the lungs 2 (two) times daily.    . DULoxetine (CYMBALTA) 30 MG capsule Take 2 capsules (60 mg total) by mouth daily. 180 capsule 1  . fluticasone (FLONASE) 50 MCG/ACT nasal spray Place 2 sprays into both nostrils daily. 16 g 6  . lamoTRIgine (LAMICTAL) 25 MG tablet Take 2 tablets (50 mg total) by mouth daily. Take 2 a day. 60 tablet 0  . levocetirizine (XYZAL) 5 MG  tablet Take 1 tablet (5 mg total) by mouth every evening. 90 tablet 1  . PROAIR HFA 108 (90 Base) MCG/ACT inhaler INHALE 2 PUFFS EVERY FOUR (4) HOURS AS NEEDED FOR WHEEZING OR SHORTNESS OF BREATH. 8.5 Inhaler 0  . [DISCONTINUED] ARIPiprazole (ABILIFY) 5 MG tablet Take 1 tablet (5 mg total) by mouth daily. (Patient not taking: Reported on 09/11/2016) 90 tablet 1  . [DISCONTINUED] esomeprazole (NEXIUM) 40 MG capsule Take 1 capsule (40 mg total) by mouth daily before breakfast. 30 capsule 0   No current facility-administered medications on file prior to visit.     BP 104/70 (BP Location: Left Arm, Patient Position: Sitting, Cuff Size: Normal)   Pulse 82   Temp 97.8 F (36.6 C) (Oral)   Ht 5\' 6"  (1.676 m)   Wt 140 lb (63.5 kg)   SpO2 98%   BMI 22.60 kg/m       Objective:   Physical Exam  General  Mental Status - Alert. General Appearance - Well groomed. Not in acute distress.  Skin Rashes- No Rashes.  HEENT Head- Normal. Ear Auditory Canal - Left- Normal. Right - Normal.Tympanic Membrane- Left- Normal. Right- Normal. Eye Sclera/Conjunctiva- Left- Normal. Right- Normal. Nose & Sinuses Nasal Mucosa- Left-  Boggy and Congested. Right-  Boggy and  Congested.Bilateral faint maxillary and faint frontal sinus pressure. Mouth & Throat Lips: Upper Lip- Normal: no dryness, cracking, pallor, cyanosis, or vesicular eruption. Lower Lip-Normal: no dryness, cracking, pallor, cyanosis or vesicular eruption. Buccal Mucosa- Bilateral- No Aphthous ulcers. Oropharynx- No Discharge or Erythema. Tonsils: Characteristics- Bilateral- No Erythema or Congestion. Size/Enlargement- Bilateral- No enlargement. Discharge- bilateral-None.  Neck Neck- Supple. No Masses.   Chest and Lung Exam Auscultation: Breath Sounds:- even and unlabored.(faint expiratory wheezing). Mild rough breath sounds left lung base.  Cardiovascular Auscultation:Rythm- Regular, rate and rhythm. Murmurs & Other Heart  Sounds:Ausculatation of the heart reveal- No Murmurs.  Lymphatic Head & Neck General Head & Neck Lymphatics: Bilateral: Description- No Localized lymphadenopathy.       Assessment & Plan:  You appear to have bronchitis. Rest hydrate and tylenol for fever. I am prescribing cough medicine benzonatate, and an antibiotic(but want to first want to get cxr/review).    Continue qvar and albuterol inhaler(use your neb as well if needed)  We gave you depomedrol 80 mg im today and taper prednisone rx.  Chest xray today.  Follow up in 7-10 days or as needed  Work note written if she feels better and no peumonia found. Wrote note for her before cxr done.  After chest xray reviewed decided to rx azithromycin antibiotic.

## 2016-10-29 NOTE — Patient Instructions (Addendum)
You appear to have bronchitis. Rest hydrate and tylenol for fever. I am prescribing cough medicine benzonatate, and an antibiotic(but want to first want to get cxr/review). Will call you on results of studies today.  Continue qvar and albuterol inhaler(use your neb as well if needed)  We gave you depomedrol 80 mg im today and taper prednisone rx.  Chest xray today.  Follow up in 7-10 days or as needed  After chest xray reviewed decided to rx azithromycin antibiotic.

## 2016-11-04 ENCOUNTER — Ambulatory Visit (HOSPITAL_COMMUNITY): Payer: PRIVATE HEALTH INSURANCE | Admitting: Licensed Clinical Social Worker

## 2016-11-04 DIAGNOSIS — F1090 Alcohol use, unspecified, uncomplicated: Secondary | ICD-10-CM

## 2016-11-04 DIAGNOSIS — IMO0002 Reserved for concepts with insufficient information to code with codable children: Secondary | ICD-10-CM

## 2016-11-04 DIAGNOSIS — F330 Major depressive disorder, recurrent, mild: Secondary | ICD-10-CM

## 2016-11-04 DIAGNOSIS — F1210 Cannabis abuse, uncomplicated: Secondary | ICD-10-CM

## 2016-11-04 DIAGNOSIS — F411 Generalized anxiety disorder: Secondary | ICD-10-CM

## 2016-11-04 NOTE — Progress Notes (Signed)
   THERAPIST PROGRESS NOTE  Session Time: 2 PM to 2:55 PM  Participation Level: Active  Behavioral Response: CasualAlertEuthymic  Type of Therapy: Individual Therapy  Treatment Goals addressed: patient work on emotional regulation skills, stabilize mood and more productive  Interventions: Motivational Interviewing, Solution Focused, Strength-based, Supportive and Other: drug and alcohol counseling  Summary: Carolyn Rasmussen is a 35 y.o. female who presents with giving therapist an update and related that she asked her boss for day shift. She can see that she has felt better when she is on a regular schedule. She relates that she has been better and not been so up and down. She hasn't taken antidepressant, Cymbalta, in a month because of expense and has been doing better in the last month. The month prior she had felt suicidal and does not endorse these feelings for the last month. She has had to cut down on pot smoking relating that smoking worsens her allergies and asthma. She had two bouts of pneumonia this year. She is hacking and not getting the full impact from smoking. She doesn't want to stop because it is the one thing she likes but realizes her friend's wisdom who told her to smoke on weekends so she can actually enjoy it, spend less money and do other things. Discussed with patient that she will find it satisfying to broaden her life and engage in more activities. Patient shared she likes crocheting, watching videos and board game. She was tired of isolating so hung out with friends and reopen some issues. She drank and realizes that that was not helpful and has to stop doing that. She completed treatment plan and wants to be more productive learn emotional regulation strategies and have better decision making. She wants to spend more time with kids and tired of making decisions that are not good for her. Therapist introduced patient to focused breathing as a coping skill introduce  emotional regulation skill of identifying and labeling feelings. Discussed trauma issues the patient may want to address later and treatment that she had to terminate pregnancy at 22 weeks that this brought up all drinking so relates apparently is still an issue.  Suicidal/Homicidal: No  Therapist Response: Reviewed progress and symptoms. Provided positive feedback for patient's insight to make healthier decisions for herself including decreasing pot use, decreasing drinking and establishing a new work schedule. Used motivational strategies to help build internal motivation to continue to make positive changes. Discussed how alcohol and pot increase depressive symptoms and pot causes amotivational syndrome. Completed treatment plan that will include patient learning emotional regulation skills, more stable decision making and being more productive. Introduced focused breathing and explained it is a Public librarianbasic skill for managing emotions as well as gaining insight to mind body connection. Introduced worksheet on labeling emotions so patient care and work effectively apply coping strategies or emotions. Provided strength based and supportive interventions.   Plan: Return again in 2 weeks.2patient will review worksheet on focused breathing and worksheet on self-monitoring of feelings form  Diagnosis: Axis I: depression, major, recurrent, mild, marijuana abuse, generalized anxiety disorder, alcohol use disorder    Axis II: No diagnosis    Bowman,Mary A, LCSW 11/04/2016

## 2016-11-05 ENCOUNTER — Telehealth: Payer: Self-pay | Admitting: Medical

## 2016-11-05 NOTE — Telephone Encounter (Signed)
Pt had a disability form sent to me. Regarding her respiratory issues. I have only see her one time. I don't know her lung history well enough to make a determination. I need to see her again and listen to her lungs. See if she is better. May need to refer her to pulmonologist. If she is at the point of not able to work. Need to get aggressive with treatment and work up. She may need pulmonary function testing. So please have her schedule appointment with me this week or early next week.

## 2016-11-06 NOTE — Telephone Encounter (Signed)
lvm advising patient of message below °

## 2016-11-11 NOTE — Telephone Encounter (Signed)
Please try calling patient again

## 2016-11-12 NOTE — Telephone Encounter (Signed)
lvm advising patient of message below °

## 2016-11-13 ENCOUNTER — Encounter: Payer: Self-pay | Admitting: Medical

## 2016-11-18 ENCOUNTER — Ambulatory Visit (HOSPITAL_COMMUNITY): Payer: Self-pay | Admitting: Licensed Clinical Social Worker

## 2016-11-26 ENCOUNTER — Ambulatory Visit (HOSPITAL_COMMUNITY): Payer: Self-pay | Admitting: Psychiatry

## 2017-03-21 ENCOUNTER — Ambulatory Visit (HOSPITAL_COMMUNITY): Payer: Self-pay | Admitting: Licensed Clinical Social Worker

## 2017-03-27 ENCOUNTER — Ambulatory Visit (INDEPENDENT_AMBULATORY_CARE_PROVIDER_SITE_OTHER): Payer: Medicaid Other | Admitting: Psychiatry

## 2017-03-27 ENCOUNTER — Encounter (HOSPITAL_COMMUNITY): Payer: Self-pay | Admitting: Psychiatry

## 2017-03-27 VITALS — BP 118/70 | HR 75 | Resp 16 | Ht 66.0 in | Wt 150.0 lb

## 2017-03-27 DIAGNOSIS — Z79899 Other long term (current) drug therapy: Secondary | ICD-10-CM | POA: Diagnosis not present

## 2017-03-27 DIAGNOSIS — Z87891 Personal history of nicotine dependence: Secondary | ICD-10-CM

## 2017-03-27 DIAGNOSIS — Z818 Family history of other mental and behavioral disorders: Secondary | ICD-10-CM | POA: Diagnosis not present

## 2017-03-27 DIAGNOSIS — F330 Major depressive disorder, recurrent, mild: Secondary | ICD-10-CM | POA: Diagnosis not present

## 2017-03-27 DIAGNOSIS — F411 Generalized anxiety disorder: Secondary | ICD-10-CM

## 2017-03-27 DIAGNOSIS — F1210 Cannabis abuse, uncomplicated: Secondary | ICD-10-CM | POA: Diagnosis not present

## 2017-03-27 DIAGNOSIS — F1290 Cannabis use, unspecified, uncomplicated: Secondary | ICD-10-CM

## 2017-03-27 MED ORDER — BUSPIRONE HCL 10 MG PO TABS: 10.0000 mg | ORAL_TABLET | Freq: Every day | ORAL | 0 refills | Status: DC | PRN

## 2017-03-27 MED ORDER — ARIPIPRAZOLE 10 MG PO TABS: 10.0000 mg | ORAL_TABLET | Freq: Every day | ORAL | 0 refills | Status: DC

## 2017-03-27 MED ORDER — DULOXETINE HCL 60 MG PO CPEP: 60.0000 mg | ORAL_CAPSULE | Freq: Every day | ORAL | 1 refills | Status: DC

## 2017-03-27 NOTE — Progress Notes (Signed)
[facility]   Patient Identification: Carolyn Rasmussen MRN:  [id number] Date of Evaluation:  03/27/2017 Referral Source: primary care Chief Complaint:   Chief Complaint    Follow-up     Visit Diagnosis:    ICD-9-CM ICD-10-CM   1. Depression, major, recurrent, mild (HCC) 296.31 F33.0   2. GAD (generalized anxiety disorder) 300.02 F41.1   3. Marijuana abuse 305.20 F12.10     History of Present Illness:  36 years old Single Caucasian femaleinitially  Initially referred by primary care physician for management of depression and anxiety she's also diagnosed with possible PTSD and borderline personality  Patient has not been seen since October last year says that she lost her insurance and her job. She has been in-home for 5 months has been feeling more depressed recently started her insurance back and started back on Abilify dose of 5 mg and she is also on Cymbalta. Some improvement but she still feels stresse. Does not endorse hallucinations  She continues to show poor insight considering she uses marijuary near daily but has cut down compared to her regular heavy use.  She uses alcohol on weekends or once a week but she does drink excessively at that time. She also was not able to start IOP. Now interested but says it doesn't cover so will have to look for options which are covered or where covered. Staying at home with parents not working has made her more upset and she is looking for a job. Does not feel hopeless to the point of suicidal thoughts but she gets desperate because of finances and not having a job  Severity of depression; has gone worse without meds. 5/10  Anxiety moderate Aggravating factors; finances living with her parents. Drug use. Different or difficult relationships Modifying factors; her kids and parents    Previous Psychotropic Medications: Yes  Abilify, neurontin  Substance Abuse History in the last 12 months:  Yes.    Consequences of  Substance Abuse: Medical Consequences:  depression,  Blackouts:  yes DT's: DUI not DT.  Past Medical History:  Past Medical History:  Diagnosis Date  . Allergy   . Anxiety   . Chronic kidney disease 2002   kidney failure secondary to dehy./ibuprof  . Depression   . Genital herpes   . GERD (gastroesophageal reflux disease)     Past Surgical History:  Procedure Laterality Date  . KIDNEY STONE SURGERY    . tubes in ears     x4  . typanoplasty      Family Psychiatric History: Grand mother had anxiety   Family History:  Family History  Problem Relation Age of Onset  . Hypertension Mother   . Hyperlipidemia Father   . Heart disease Father   . Anxiety disorder Maternal Grandfather   . Anxiety disorder Maternal Grandmother   . Anxiety disorder Paternal Grandfather   . Anxiety disorder Paternal Grandmother     Social History:   Social History   Social History  . Marital status: Divorced    Spouse name: N/A  . Number of children: 2  . 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 Smoking status: Former Smoker    Packs/day: 0.30  . Smokeless tobacco: Current User     Comment: 3 cigerettes daily for 3 months   . Alcohol use 3.6 oz/week    6 Standard drinks or equivalent per week  .  Drug use: Yes    Frequency: 10.0 times per week    Types: Marijuana  . Sexual activity: Yes    Partners: Male    Birth control/ protection: IUD   Other Topics Concern  . None   Social History Narrative  . None      Allergies:   Allergies  Allergen Reactions  . Ceftin [Cefuroxime Axetil] Rash  . Sulfa Antibiotics     Unknown, childhood allergy    Metabolic Disorder Labs: Lab Results  Component Value Date   HGBA1C 5.4 05/10/2016   No results found for: PROLACTIN Lab Results  Component Value Date   CHOL 161 05/10/2016   TRIG 133.0 05/10/2016   HDL 44.90 05/10/2016   CHOLHDL 4 05/10/2016   VLDL  26.6 05/10/2016   LDLCALC 89 05/10/2016     Current Medications: Current Outpatient Prescriptions  Medication Sig Dispense Refill  . ARIPiprazole (ABILIFY) 10 MG tablet Take 1 tablet (10 mg total) by mouth daily. 30 tablet 0  . azithromycin (ZITHROMAX) 250 MG tablet Take 2 tablets by mouth on day 1, followed by 1 tablet by mouth daily for 4 days. 6 tablet 0  . cetirizine (ZYRTEC) 10 MG tablet Take by mouth.    . DULoxetine (CYMBALTA) 60 MG capsule Take 1 capsule (60 mg total) by mouth daily. 30 capsule 1  . fluticasone (FLONASE) 50 MCG/ACT nasal spray Place 2 sprays into both nostrils daily. 16 g 6  . PROAIR HFA 108 (90 Base) MCG/ACT inhaler INHALE 2 PUFFS EVERY FOUR (4) HOURS AS NEEDED FOR WHEEZING OR SHORTNESS OF BREATH. 8.5 Inhaler 0   No current facility-administered medications for this visit.       Psychiatric Specialty Exam: Review of Systems  Cardiovascular: Negative for chest pain and palpitations.  Skin: Negative for rash.  Neurological: Negative for tingling.  Psychiatric/Behavioral: Positive for depression and substance abuse. Negative for suicidal ideas. The patient is nervous/anxious.     Blood pressure 118/70, pulse 75, resp. rate 16, height  (1.676 m), weight 150 lb (68 kg), SpO2 99 %.Body mass index is 24.21 kg/m.  General Appearance: Casual  Eye Contact:  Minimal  Speech:  Slow  Volume:  Decreased  Mood: stressed  Affect:  congruent  Thought Process:  Linear  Orientation:  Full (Time, Place, and Person)  Thought Content:  Rumination  Suicidal Thoughts:  No  Homicidal Thoughts:  No  Memory:  Immediate;   Fair Recent;   Fair  Judgement:  Fair  Insight:  Shallow  Psychomotor Activity:  Decreased  Concentration:  Concentration: Fair and Attention Span: Fair  Recall:  [employer] of Knowledge:Fair  Language: Fair  Akathisia:  No  Handed:  Right  AIMS (if indicated):    Assets:  Desire for Improvement  ADL's:  Intact  Cognition: WNL  Sleep:   Variable to fair    Treatment Plan Summary: Medication management and Plan as follows  Major depression possible part of mood disorder; relavant to psychosocial issues and marijuana use. contine cymbalta change to  one tablet.  Increase abilify to  qd.    GAD: ongoing. Continue cymbalta and will add buspirone  Marijuana use: she needs to quit on that as well alcohol. Understands medications would not work otherwise.  She will explore IOP options as well Provided supportive therapy discussed and reviewed side effects and concerns follow  Do recommend Psychotherapy  Thresa Ross, MD 4/19/20181:50 PM

## 2017-04-03 ENCOUNTER — Ambulatory Visit (INDEPENDENT_AMBULATORY_CARE_PROVIDER_SITE_OTHER): Payer: Medicaid Other | Admitting: Licensed Clinical Social Worker

## 2017-04-03 DIAGNOSIS — Z8659 Personal history of other mental and behavioral disorders: Secondary | ICD-10-CM | POA: Diagnosis not present

## 2017-04-03 DIAGNOSIS — F411 Generalized anxiety disorder: Secondary | ICD-10-CM

## 2017-04-03 DIAGNOSIS — F332 Major depressive disorder, recurrent severe without psychotic features: Secondary | ICD-10-CM | POA: Diagnosis not present

## 2017-04-03 NOTE — Progress Notes (Signed)
THERAPIST PROGRESS NOTE  Session Time: 1:00pm-2:00pm  Participation Level: Active  Behavioral Response: DisheveledAlertDysphoric  Type of Therapy: Individual Therapy  Treatment Goals addressed: Emotion regulation  Interventions: Assessment and treatment planning  Suicidal/Homicidal: Admitted to vague suicidal ideation, no plan, identifies her children as a protective factor, denied HI  Therapist Interventions: Had patient complete a PHQ-9 and GAD-7 to assess for severity of depression and anxiety.  Gathered information about current schedule and level of functioning.  Reviewed her treatment plan.   Recommended developing more structure to her daily routine.  Recommended incorporating yoga into that routine. Introduced patient to the concept of mindfulness.  Described how it can be helpful to practice at times when you are having distressing thoughts or feelings.        Summary:   Depression screen [facility] 2/9 04/03/2017 09/11/2016 04/11/2016 06/02/2014 10/15/2012  Decreased Interest Down, Depressed, Hopeless PHQ - 2 Score Altered sleeping -  Tired, decreased energy -  Change in appetite 1 3 - 3 -  Feeling bad or failure about yourself  -  Trouble concentrating -  Moving slowly or fidgety/restless 0 0 0 0 -  Suicidal thoughts 0 -  PHQ-9 Score -  Difficult doing work/chores - Extremely dIfficult Very difficult - -   GAD 7 : Generalized Anxiety Score 04/03/2017 09/11/2016  Nervous, Anxious, on Edge 3 3  Control/stop worrying 3 3  Worry too much - different things 3 3  Trouble relaxing 3 3  Restless 1 3  Easily annoyed or irritable 3 3  Afraid - awful might happen 1 1  Total GAD 7 Score 17 19  Anxiety Difficulty Extremely difficult Extremely difficult   Reported she is unemployed and hasn't been receiving child support so financially things are difficult.   Typically wakes up around 9am.  Varies as to  whether or not she eats breakfast.  While waiting for her kids to get home she watches TV off and on, attempts to crochet, or attempts to read.  Has trouble staying focused on these tasks.  She makes dinner and eats that meal with her kids.  Typically goes to bed around 9pm.  Acknowledged that she needs a more consistent routine focused on self-care.  Between now and next session she plans to work on eating regularly, taking her medication at the same time each day, and doing yoga at least once a week.    Somewhat familiar with the concept of mindfulness.  Previous therapists have talked about it.  Noted that it is difficult for her to stay in the present and practice being non-judgmental.   Mentioned she was diagnosed previously (approximately 2003) with Borderline Personality Disorder.  Reported she has learned about the disorder and that she does believes the description fits her.          Plan: Next time will go more in depth as to how to practice mindfulness.  Diagnosis:  Major Depressive Disorder, recurrent, severe                      GAD                      History of Borderline Personality Disorder     Lynnea Ferrier,  Shawn Route, LCSW 04/03/2017

## 2017-04-22 ENCOUNTER — Ambulatory Visit (INDEPENDENT_AMBULATORY_CARE_PROVIDER_SITE_OTHER): Payer: Medicaid Other | Admitting: Psychiatry

## 2017-04-22 ENCOUNTER — Encounter (HOSPITAL_COMMUNITY): Payer: Self-pay | Admitting: Psychiatry

## 2017-04-22 VITALS — BP 116/80 | HR 76 | Ht 64.0 in | Wt 153.0 lb

## 2017-04-22 DIAGNOSIS — Z79899 Other long term (current) drug therapy: Secondary | ICD-10-CM | POA: Diagnosis not present

## 2017-04-22 DIAGNOSIS — F411 Generalized anxiety disorder: Secondary | ICD-10-CM | POA: Diagnosis not present

## 2017-04-22 DIAGNOSIS — Z87891 Personal history of nicotine dependence: Secondary | ICD-10-CM | POA: Diagnosis not present

## 2017-04-22 DIAGNOSIS — F332 Major depressive disorder, recurrent severe without psychotic features: Secondary | ICD-10-CM

## 2017-04-22 DIAGNOSIS — Z8659 Personal history of other mental and behavioral disorders: Secondary | ICD-10-CM

## 2017-04-22 DIAGNOSIS — F1210 Cannabis abuse, uncomplicated: Secondary | ICD-10-CM

## 2017-04-22 DIAGNOSIS — Z818 Family history of other mental and behavioral disorders: Secondary | ICD-10-CM | POA: Diagnosis not present

## 2017-04-22 MED ORDER — BUSPIRONE HCL 10 MG PO TABS: 10.0000 mg | ORAL_TABLET | Freq: Every day | ORAL | 1 refills | Status: DC | PRN

## 2017-04-22 MED ORDER — ARIPIPRAZOLE 10 MG PO TABS: 10.0000 mg | ORAL_TABLET | Freq: Every day | ORAL | 1 refills | Status: DC

## 2017-04-22 NOTE — Progress Notes (Signed)
[facility]   Patient Identification: Carolyn Rasmussen MRN:  [id number] Date of Evaluation:  04/22/2017 Referral Source: primary care Chief Complaint:   Chief Complaint    Follow-up     Visit Diagnosis:    ICD-9-CM ICD-10-CM   1. Severe recurrent major depression without psychotic features (HCC) 296.33 F33.2   2. GAD (generalized anxiety disorder) 300.02 F41.1   3. History of borderline personality disorder V11.8 Z86.59   4. Marijuana abuse 305.20 F12.10     History of Present Illness:  36 years old Single Caucasian femaleinitially  Initially referred by primary care physician for management of depression and anxiety she's also diagnosed with possible PTSD and borderline personality  Patient was homebound for the last 5-6 months prior to their last visit. Extreme anxiety and also using marijuana burning frequently. Last visit we added buspirone and adjusted some medications including Abilify was increased to 10 mg that is helped she is feeling less anxious she is able to cut down her marijuana significantly now only one time a day which is far less than she has been using before. Her libido and sex drive is better mood is improved  Now looking for a job  Severity of depression;improved. 7/10 Anxiety moderate Aggravating factors; finances, . boredom Modifying factors; her kids    Previous Psychotropic Medications: Yes  Abilify, neurontin  Substance Abuse History in the last 12 months:  Yes.    Consequences of Substance Abuse: Medical Consequences:  depression,  Blackouts:  yes DT's: DUI not DT.  Past Medical History:  Past Medical History:  Diagnosis Date  . Allergy   . Anxiety   . Chronic kidney disease 2002   kidney failure secondary to dehy./ibuprof  . Depression   . Genital herpes   . GERD (gastroesophageal reflux disease)     Past Surgical History:  Procedure Laterality Date  . KIDNEY STONE SURGERY    . tubes in ears     x4  .  typanoplasty      Family Psychiatric History: Grand mother had anxiety   Family History:  Family History  Problem Relation Age of Onset  . Hypertension Mother   . Hyperlipidemia Father   . Heart disease Father   . Anxiety disorder Maternal Grandfather   . Anxiety disorder Maternal Grandmother   . Anxiety disorder Paternal Grandfather   . Anxiety disorder Paternal Grandmother     Social History:   Social History   Social History  . Marital status: Divorced    Spouse name: N/A  . Number of children: 2  . Years of education: N/A   Occupational History  . ER EMT [employer]   Social History Main Topics  . Smoking status: Former Smoker    Packs/day: 0.30  . Smokeless tobacco: Current User     Comment: 3 cigerettes daily for 3 months   . Alcohol use No  . Drug use: Yes    Frequency: 7.0 times per week    Types: Marijuana, Solvent inhalants  . Sexual activity: Yes    Partners: Male    Birth control/ protection: IUD   Other Topics Concern  . None   Social History Narrative  . None      Allergies:   Allergies  Allergen Reactions  . Ceftin [Cefuroxime Axetil] Rash  . Sulfa Antibiotics     Unknown, childhood allergy    Metabolic Disorder Labs: Lab Results  Component Value Date   HGBA1C 5.4 05/10/2016  No results found for: PROLACTIN Lab Results  Component Value Date   CHOL 161 05/10/2016   TRIG 133.0 05/10/2016   HDL 44.90 05/10/2016   CHOLHDL 4 05/10/2016   VLDL 26.6 05/10/2016   LDLCALC 89 05/10/2016     Current Medications: Current Outpatient Prescriptions  Medication Sig Dispense Refill  . ARIPiprazole (ABILIFY) 10 MG tablet Take 1 tablet (10 mg total) by mouth daily. 30 tablet 1  . busPIRone (BUSPAR) 10 MG tablet Take 1 tablet (10 mg total) by mouth daily as needed. Take one a day 30 tablet 1  . celecoxib (CELEBREX) 100 MG capsule Take 100 mg by mouth daily.    . cetirizine (ZYRTEC) 10 MG tablet Take by mouth.    . DULoxetine  (CYMBALTA) 60 MG capsule Take 1 capsule (60 mg total) by mouth daily. 30 capsule 1  . PROAIR HFA 108 (90 Base) MCG/ACT inhaler INHALE 2 PUFFS EVERY FOUR (4) HOURS AS NEEDED FOR WHEEZING OR SHORTNESS OF BREATH. 8.5 Inhaler 0  . azithromycin (ZITHROMAX) 250 MG tablet Take 2 tablets by mouth on day 1, followed by 1 tablet by mouth daily for 4 days. (Patient not taking: Reported on 04/22/2017) 6 tablet 0  . fluticasone (FLONASE) 50 MCG/ACT nasal spray Place 2 sprays into both nostrils daily. (Patient not taking: Reported on 04/22/2017) 16 g 6   No current facility-administered medications for this visit.       Psychiatric Specialty Exam: Review of Systems  Skin: Negative for itching.  Neurological: Negative for tingling.  Psychiatric/Behavioral: Positive for substance abuse. Negative for suicidal ideas.    Blood pressure 116/80, pulse 76, height 5\' 4"  (1.626 m), weight 153 lb (69.4 kg), SpO2 99 %.Body mass index is 26.26 kg/m.  General Appearance: Casual  Eye Contact:  Minimal  Speech:  Slow  Volume:  Decreased  Mood: better  Affect:  Improved affect  Thought Process:  Linear  Orientation:  Full (Time, Place, and Person)  Thought Content:  Rumination  Suicidal Thoughts:  No  Homicidal Thoughts:  No  Memory:  Immediate;   Fair Recent;   Fair  Judgement:  Fair  Insight:  Shallow  Psychomotor Activity:  Decreased  Concentration:  Concentration: Fair and Attention Span: Fair  Recall:  [employer] of Knowledge:Fair  Language: Fair  Akathisia:  No  Handed:  Right  AIMS (if indicated):    Assets:  Desire for Improvement  ADL's:  Intact  Cognition: WNL  Sleep:  Variable to fair    Treatment Plan Summary: Medication management and Plan as follows  Major depression possible part of mood disorder; relavant to psychosocial issues and marijuana use. Continue cymbalta. Mood has improved. Continue abilify 10mg . No tremors    GAD: improved. Continue buspar and cymbalta  Marijuana use:  she has cut down. Now plan is abstinence so she can look for job Continue therapy to develop coping skills  Provided supportive therapy discussed the side effects concerns were addressed follow-up in 2 months for new prescription she may need another refill for Cymbalta she has one for now Thresa Ross, MD 5/15/20182:06 PM

## 2017-04-23 ENCOUNTER — Ambulatory Visit (INDEPENDENT_AMBULATORY_CARE_PROVIDER_SITE_OTHER): Payer: Medicaid Other | Admitting: Licensed Clinical Social Worker

## 2017-04-23 DIAGNOSIS — F411 Generalized anxiety disorder: Secondary | ICD-10-CM

## 2017-04-23 DIAGNOSIS — F332 Major depressive disorder, recurrent severe without psychotic features: Secondary | ICD-10-CM

## 2017-04-23 DIAGNOSIS — F1210 Cannabis abuse, uncomplicated: Secondary | ICD-10-CM | POA: Diagnosis not present

## 2017-04-23 NOTE — Progress Notes (Signed)
Session Time: 11:00 AM to 11:55 AM  Participation Level: Active  Behavior Response: CasualAlertEuthymic  Type of Therapy: individual therapy  Treatment Goals Addressed: patient work on emotional regulation skills, stabilize mood and more productive, decrease use to abstinence and implement recovery program  Interventions: Solution Focused, Strength-based, Supportive, Family Systems and Other: substance abuse counseling  Summary: Carolyn Rasmussen is a 36 y.o. female who presents with decrease of marijuana use where she was smoking daily all day about 1/4 ounce a week to 1 bowl 1/2 gram in four days. She is that medication is been helpful with her anxiety so she has not needed to smoke and notices the positive impact from not smoking. Related that when sober she would get bored and upset so would smoke again and now that anxiety is decreased she has not caught in that cycle. She is not having cravings or severe anxiety issues. She relates that "I  actually care". She is engaging in activities that she enjoys again like reading and crochet. Completed treatment plan and will work on stability in decision making, emotional regulation, decrease in anxiety and building a recovery program. She relates that a positive sign for anxiety will be increase in social activity. He relates that she has felt weird and needs special group to hang out with. Before she wouldn't go if she couldn't smoke. She is connecting to a support group that she feels comfortable with and this will help with her socializing. ,She rates her depression as 8/10-with 10 improved and anxiety as 6/10 with 10 improved. She knows interactions will get better once she lessens pot use. She is motivated to decrease use to abstinence. Shared that she filled out an application for animal shelter and this will help her be busier and engage in an activity to give her purpose. She has been better about personal hygiene, cleaning and needs to keep upu  with doing laundry. Identifies needing to work on self-esteem. Identifies needing to work on self-esteem. Shared learning more about her family dynamics that she knows much have had an impact on her. Shared that she has found out that her mom isolates to her room. She has just learned past history of her dad that has shaken her and confused her as he is supposed to be her rock. Shared that mom has revealed jealous of her relationship with dad. Plans to start yoga but does not want to be overwhelmed. Reviewed session that she feels better about the steps she is talking and to continue with that.   Suicidal/Homicidal: No  Therapist Response: Therapist reviewed progress of symptoms and reinforced progress patient has made to encourage continued progress. Discussed developing schedule and developing healthy habits and patient has taken positive steps to implement in her life and the plan is for her to continue positive activities and continue to maintain personal responsibilities. Identified connection between medication, decrease in substance abuse and improvement in mental health symptoms. Encourage patient in taking positive steps to develop a recovery program including connecting with AA. Develop treatment plan. Help patient to process through feelings related to new information she has found about father and more awareness of dynamics from upbringing. Identified working on self-esteem as an issue for therapy. Provided strength based and supportive interventions.  Plan: Return again in 2 weeks.2.patient work on building and maintaining structure to help in functioning and building foundation to support her in decrease of substance use.3.Educate patient on coping skills to decrease anxiety and depression.   Diagnosis: Axis  I:  major depressive disorder, recurrent, severe, generalized anxiety disorder, marijuana abuse    Axis II: No diagnosis    Coolidge Breeze, LCSW 04/23/2017

## 2017-04-29 ENCOUNTER — Ambulatory Visit (HOSPITAL_COMMUNITY): Payer: Self-pay | Admitting: Licensed Clinical Social Worker

## 2017-05-12 ENCOUNTER — Ambulatory Visit (INDEPENDENT_AMBULATORY_CARE_PROVIDER_SITE_OTHER): Payer: Medicaid Other | Admitting: Licensed Clinical Social Worker

## 2017-05-12 DIAGNOSIS — F332 Major depressive disorder, recurrent severe without psychotic features: Secondary | ICD-10-CM | POA: Diagnosis not present

## 2017-05-12 DIAGNOSIS — F411 Generalized anxiety disorder: Secondary | ICD-10-CM

## 2017-05-12 DIAGNOSIS — F1210 Cannabis abuse, uncomplicated: Secondary | ICD-10-CM | POA: Diagnosis not present

## 2017-05-12 NOTE — Progress Notes (Signed)
   THERAPIST PROGRESS NOTE  Session Time: 4 PM to 4:55 PM  Participation Level: Active  Behavioral Response: CasualAlertEuthymic  Type of Therapy: Individual Therapy  Treatment Goals addressed:  patient work on emotional regulation skills, stabilize mood and more productive, decrease use to abstinence and implement recovery program  Interventions: CBT, Motivational Interviewing, Solution Focused, Strength-based, Supportive and Family Systems  Summary: Carolyn Rasmussen is a 36 y.o. female who presents with "not too bad". Mom is giving her trouble and not supportive no matter what she does. She tries to cater to her and it doesn't work. Wants to go back to school, mom suggested [employer] and then related that they were unable to support her financially. Patient hasn't been working for 7 months and needs to find a job. She describes that that her mom has always been critical of everything, negative, not supportive but relates she can only blame herself far. Patient reflected and said prep she has to be her own cheerleader. Relates her father is more supportive. Discussed her plans of going back to school and getting associated and then going on to bachelors. 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 She relates she has been decreasing use of marijuana and plans not to use from today. She realizes that it makes big difference how she feels in a positive way, not as anxious, particularly socially and before she just didn't care. Lites that the BuSpar is helping her a lot. Discussed ways she has trust and a positive attitude and believes that things will eventually work out. Relates her attitude that she continues to move forward in a positive way, before didn't care. She relates finding things she enjoys in life again such as doing her crochet work. Reviewed session  and patient will keep moving forward despite the negativity she experiences.    Suicidal/Homicidal: No  Therapist Response: Help patient to process through current stressor of negativity of mom. Discussed coping strategies she could use including self talk to help her in gaining perspective, more distance from negativity and  that are more helpful and accurate. Discussed how spending time with others including recovery group will  to help her in gaining perspective and space from negativity. Discussed attitudes of patience, acceptance and trust that are helpful in dealing with uncertainties and stressors. Discussed that with patience is the recognition that it's often true that difficult situations cannot be worked out immediately. All the pieces that contribute to the solution come together gradually over a period of time. Provided positive feedback to encourage continued progress in patient's motivation to keep moving forward with positive coping strategies and explained that this approach will help her moved forward toward her goals. Provided positive feedback for patient's problem solving to manage stressors as well as progress with recovery that has moved to abstinence. Provided supportive and strength-based interventions.   Plan: Return again in 2 weeks.2. Patient continued to gain insight and implement effective coping strategies to manage mood, stressors and discontinue usage  Diagnosis: Axis I:  major depressive disorder, recurrent, severe, generalized anxiety disorder, marijuana abuse    Axis II: No diagnosis    Coolidge BreezeMary Bowman, LCSW 05/12/2017

## 2017-05-20 IMAGING — DX DG CHEST 2V
2 series · 2 of 2 positions shown · non-contrast
Comparison: 10/23/2015

CLINICAL DATA: Pt c/o cough, congestion, wheezing for 2 weeks,
denies any other problems or complaints today, GERD

EXAM:
CHEST  2 VIEW

[chest pa]
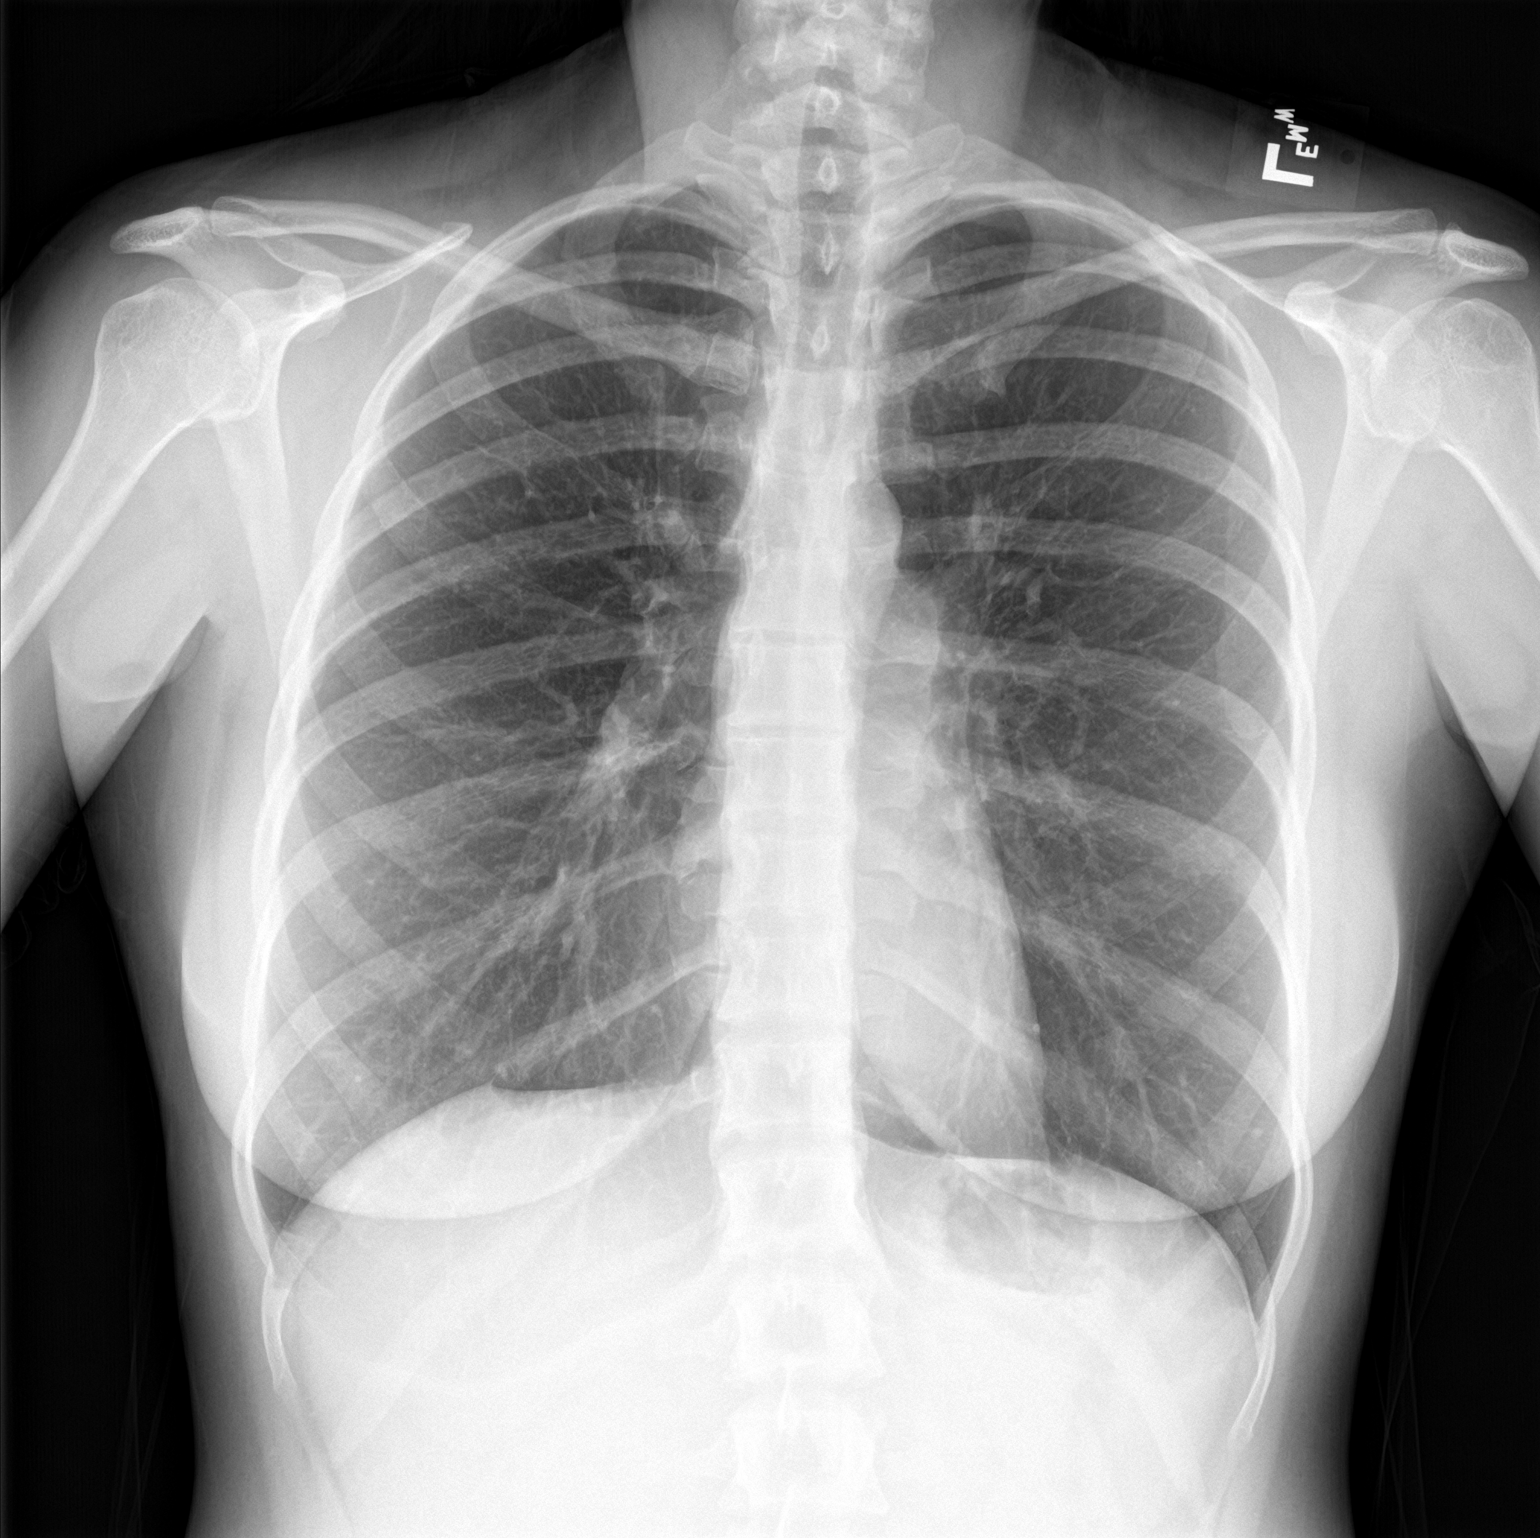

[chest lat]
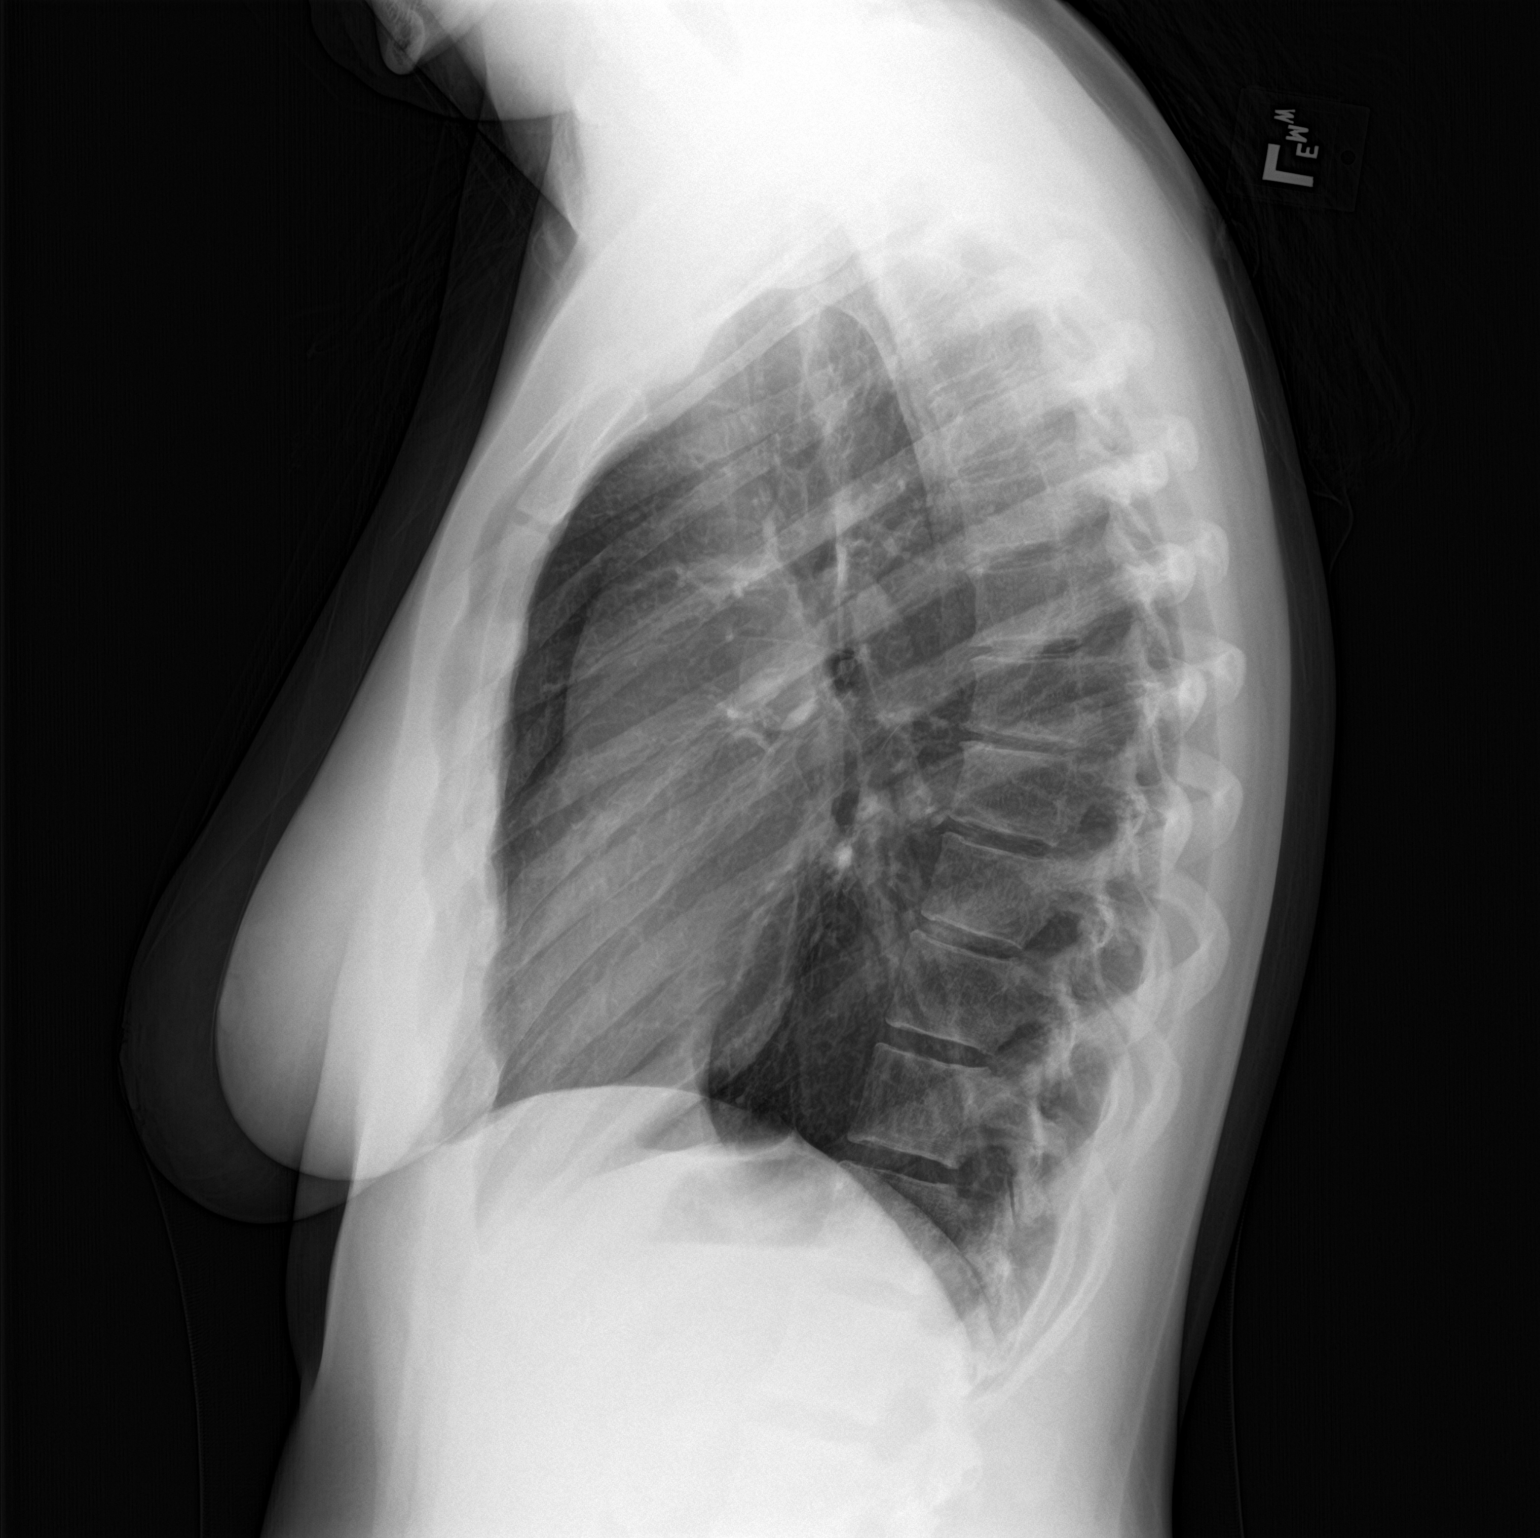

[2 of 2 positions shown; findings below may reference images not displayed]

FINDINGS: Marked convex right thoracic spine curvature. Midline trachea.
Normal heart size and mediastinal contours.Sharp costophrenic
angles. No pneumothorax. Clear lungs.
IMPRESSION: No active cardiopulmonary disease.

## 2017-05-26 ENCOUNTER — Ambulatory Visit (INDEPENDENT_AMBULATORY_CARE_PROVIDER_SITE_OTHER): Payer: Medicaid Other | Admitting: Licensed Clinical Social Worker

## 2017-05-26 DIAGNOSIS — F411 Generalized anxiety disorder: Secondary | ICD-10-CM | POA: Diagnosis not present

## 2017-05-26 DIAGNOSIS — F332 Major depressive disorder, recurrent severe without psychotic features: Secondary | ICD-10-CM | POA: Diagnosis not present

## 2017-05-26 DIAGNOSIS — F1210 Cannabis abuse, uncomplicated: Secondary | ICD-10-CM

## 2017-05-26 NOTE — Progress Notes (Signed)
   THERAPIST PROGRESS NOTE  Session Time: 3 PM to 345 PM  Participation Level: Active  Behavioral Response: CasualAlertBlunted  Type of Therapy: Individual Therapy  Treatment Goals addressed:  patient work on emotional regulation skills, stabilize mood and more productive, decrease use to abstinence and implement recovery program  Interventions: CBT, Motivational Interviewing, Solution Focused, Strength-based, Supportive and Other: Drug and alcohol counseling  Summary: Carolyn Rasmussen is a 36 y.o. female who presents with still nemployed, smoking once a day, good possibility of going to [employer]. Her plan will be to finish two classes with associate degree. Shared her frustrations of trying to get a job and not working. Won't be able to go on vacation with family but also identifies the positive and getting time away from her family. Identified getting work as will be helpful with mood. 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 Describes what she sees as tremendous progress in cutting down massively and smoking pot. For the most part she feels better and only dealing with situational issues, Buspar helping a lot with anxiety. Shares that she is moving slowly but moving. Described a recent episode of social anxiety where she was with a bunch of people she didn't know and everyone look like they were having fun so she left. Explored this with therapist and recognizes that environment where there is drinking is not a good one for her. Provides her with more motivation to seek out recovery group that she enjoyed spending time with and helped her feel included. Discussed progress with social anxiety and related [facility] she doesn't have problems. Discussed how her attitude has changed. Discussed the helpfulness of being able to detach in terms of developing new interpersonal relationships,  Reviewed session, wants to look into the book discussed in session "The Art of not  Giving a F*ck" and look into home group.  Suicidal/Homicidal: No  Therapist Response: Reviewed patient's progress and symptoms and identified patient's significant decrease and marijuana use and decrease as a result of anxiety as good progress. Help patient to process feelings around stressors. Discussed attitude shift is helpful for her with social anxiety and managing certain situational stressors better. Discussed specifically not caring as much about what other people thinking, choosing what she decides to focus on to care about in other words she choosing her own values to care about. Identified environment as a factor for recent anxiety and finding more supportive and healthier environments would be better for her. Discussed developing narratives related to stressors that helps one cope but at the same time recognizing these narratives make sense and are accurate. Discussed how learning to to detach is helpful in coping specifically dealing with inevitably of change. Utilized motivational strategies to encourage patient to continue to make positive choices in her life.    Plan: Return again in 2 weeks.2. Therapist continued to work with patient on healthy coping for stressors and emotional regulation strategies  Diagnosis: Axis I:   major depressive disorder, recurrent, severe, generalized anxiety disorder, marijuana abuse    Axis II: No diagnosis    Coolidge BreezeMary Bowman, LCSW 05/26/2017

## 2017-06-08 ENCOUNTER — Other Ambulatory Visit (HOSPITAL_COMMUNITY): Payer: Self-pay | Admitting: Psychiatry

## 2017-06-10 NOTE — Telephone Encounter (Signed)
Medication refill- received fax from [facility] requesting a refill for Cymbalta. Per Dr. Gilmore LarocheAkhtar, refill is authorize for Cymbalta 60mg , #30. Rx was sent to pharmacy. Pt's next apt is schedule on 06/17/17. Lvm informing pt of refill status.

## 2017-06-17 ENCOUNTER — Ambulatory Visit (HOSPITAL_COMMUNITY): Payer: Self-pay | Admitting: Psychiatry

## 2017-06-20 ENCOUNTER — Ambulatory Visit (HOSPITAL_COMMUNITY): Payer: Self-pay | Admitting: Licensed Clinical Social Worker

## 2017-08-15 ENCOUNTER — Other Ambulatory Visit (HOSPITAL_COMMUNITY): Payer: Self-pay | Admitting: Psychiatry

## 2017-08-15 ENCOUNTER — Other Ambulatory Visit: Payer: Self-pay | Admitting: Physician Assistant

## 2017-08-15 DIAGNOSIS — M25562 Pain in left knee: Principal | ICD-10-CM

## 2017-08-15 DIAGNOSIS — M25561 Pain in right knee: Principal | ICD-10-CM

## 2017-08-15 DIAGNOSIS — M25551 Pain in right hip: Principal | ICD-10-CM

## 2017-08-15 DIAGNOSIS — G8929 Other chronic pain: Secondary | ICD-10-CM

## 2017-08-15 DIAGNOSIS — M25552 Pain in left hip: Principal | ICD-10-CM

## 2017-08-24 ENCOUNTER — Other Ambulatory Visit (HOSPITAL_COMMUNITY): Payer: Self-pay | Admitting: Psychiatry

## 2017-08-26 NOTE — Telephone Encounter (Signed)
Medication refill- received fax from [facility] for Abilify. Per Dr. Gilmore Laroche, refill request is denied. Pt will need to schedule an apt with the office. Lvm for pt to return call to office.

## 2020-02-22 ENCOUNTER — Encounter: Payer: Self-pay | Admitting: Nurse Practitioner

## 2020-02-22 ENCOUNTER — Ambulatory Visit (INDEPENDENT_AMBULATORY_CARE_PROVIDER_SITE_OTHER): Payer: 59 | Admitting: Nurse Practitioner

## 2020-02-22 ENCOUNTER — Other Ambulatory Visit: Payer: Self-pay

## 2020-02-22 VITALS — BP 98/60 | HR 74 | Temp 97.3°F | Wt 120.4 lb

## 2020-02-22 DIAGNOSIS — R634 Abnormal weight loss: Secondary | ICD-10-CM

## 2020-02-22 DIAGNOSIS — K219 Gastro-esophageal reflux disease without esophagitis: Secondary | ICD-10-CM

## 2020-02-22 DIAGNOSIS — K649 Unspecified hemorrhoids: Secondary | ICD-10-CM

## 2020-02-22 DIAGNOSIS — Z01818 Encounter for other preprocedural examination: Secondary | ICD-10-CM | POA: Diagnosis not present

## 2020-02-22 DIAGNOSIS — R1310 Dysphagia, unspecified: Secondary | ICD-10-CM

## 2020-02-22 MED ORDER — HYDROCORTISONE (PERIANAL) 2.5 % EX CREA: 1.0000 "application " | TOPICAL_CREAM | Freq: Every day | CUTANEOUS | 1 refills | Status: AC

## 2020-02-22 NOTE — Progress Notes (Signed)
ASSESSMENT / PLAN:   Referring Provider:   Suzanna Obey, MD Reason for Referral : Dysphagia  Carolyn Rasmussen is a 39 y.o. female with a pmh significant for, but not necessarily limited to, alcoholism, anxiety, arthritis, asthma, depression, IBS, kidney stones, GERD/esophageal stricture   #GERD.  -Asymptomatic on daily PPI in addition to discontinuation of alcohol several months ago  # Recurrent dysphagia --GE J stricture in 2012, status post dilation ( Dr. Fuller Plan) --Schedule EGD with probable dilation. The risks and benefits of EGD were discussed and the patient agrees to proceed.   # IBS.   --Chronic loose stool but no more than 2-3 times a day  # Weight loss --Reports 30 pound weight loss since July --Attributes weight loss to stress/anxiety --She skips meals  # Hemorrhoids --Tend to flare when stools are too loose --She is requesting steroid cream.  We will try Anusol cream at night x10 days  # History of Alcoholism --Drank heavily between age [age] --Sober for last several months  # Anxiety --Managed with medications  # [place] of adenomatous colon polyps in mother --We will need to reevaluate patient for colonoscopy should she develop rectal bleeding, bowel changes, anemia or other red flags. Otherwise mother's history of colon polyps should not increase patient's risk for colon cancer  --Screening colonoscopy due at age 71 based on guidelines  HPI:     Chief Complaint: Problem swallowing   Carolyn Rasmussen was evaluated here in 2012 for evaluation of dysphagia.  EGD showed stricture at GE junction, status post dilation.  Carolyn Rasmussen says she did well from a swallowing standpoint for several years.  A few years ago she began having recurrent problems swallowing steak.  Dysphagia has progressed, now have problems with the food such as eggs and fish.  She gives a history of GERD manifested as heartburn.  Symptoms controlled with PPI.  She has a history of  alcoholism and believes that discontinuation of alcohol several months ago also helped the heartburn.   Carolyn Rasmussen says she has lost about 30 pounds since July.  She attributes weight loss to stress/anxiety.  She has also been skipping meals.  Carolyn Rasmussen gives a history of IBS, generally stools are loose but no more than 3 times a day.  She has tried Imodium but it gives her cramps.  No blood in stool.  She does get swelling of external hemorrhoids if stools become too loose.  She is requesting hemorrhoidal cream.  She does get perianal irritation with minor external bleeding from time to time  Data Reviewed:   Pulled out recent labs on her mobile phone. 02/07/2020 WBC 10.2, hemoglobin 14.5, MCV 95, platelets 241 ESR 25 TSH 0.54 BUN 13, creatinine 0.69 Liver test normal    Past Medical History:  Diagnosis Date  . Allergy   . Anxiety   . Chronic kidney disease 2002   kidney failure secondary to dehy./ibuprof  . Depression   . Esophageal stricture   . Genital herpes   . GERD (gastroesophageal reflux disease)      Past Surgical History:  Procedure Laterality Date  . KIDNEY STONE SURGERY    . tubes in ears     x4  . typanoplasty     Family History  Problem Relation Age of Onset  . Hypertension Mother   . Hyperlipidemia Father   . Heart disease Father   . Anxiety disorder Maternal  Grandfather   . Anxiety disorder Maternal Grandmother   . Anxiety disorder Paternal Grandfather   . Anxiety disorder Paternal Grandmother    Social History   Tobacco Use  . Smoking status: Former Smoker    Packs/day: 0.30  . Smokeless tobacco: Current User  . Tobacco comment: 3 cigerettes daily for 3 months   Substance Use Topics  . Alcohol use: No  . Drug use: Yes    Frequency: 7.0 times per week    Types: Marijuana, Solvent inhalants   Current Outpatient Medications  Medication Sig Dispense Refill  . buPROPion (WELLBUTRIN XL) 150 MG 24 hr tablet Take 1 tablet by mouth daily.    .  cetirizine (ZYRTEC) 10 MG tablet Take 10 mg by mouth daily.    . Cholecalciferol 25 MCG (1000 UT) CHEW Chew 1 tablet by mouth daily.    Marland Kitchen levonorgestrel (MIRENA) 20 MCG/24HR IUD by Intrauterine route.    Marland Kitchen omeprazole (PRILOSEC) 20 MG capsule Take 20 mg by mouth daily.    Marland Kitchen PROAIR HFA 108 (90 Base) MCG/ACT inhaler INHALE 2 PUFFS EVERY FOUR (4) HOURS AS NEEDED FOR WHEEZING OR SHORTNESS OF BREATH. 8.5 Inhaler 0  . SERTRALINE HCL PO Take 150 mg by mouth daily.    . Vitamins/Minerals TABS      No current facility-administered medications for this visit.   Allergies  Allergen Reactions  . Ceftin [Cefuroxime Axetil] Rash  . Sulfa Antibiotics     Unknown, childhood allergy     Review of Systems: Positive for allergies and sinus trouble, anxiety, arthritis, back pain, depression, hearing problems.  All other systems reviewed and negative except where noted in HPI.   Creatinine clearance cannot be calculated (Patient's most recent lab result is older than the maximum 21 days allowed.)   Physical Exam:    Wt Readings from Last 3 Encounters:  02/22/20 120 lb 6 oz (54.6 kg)  10/29/16 140 lb (63.5 kg)  09/02/16 140 lb 12.8 oz (63.9 kg)    BP 98/60   Pulse 74   Temp (!) 97.3 F (36.3 C)   Wt 120 lb 6 oz (54.6 kg)   BMI 20.66 kg/m  Constitutional:  Pleasant female in no acute distress. Psychiatric: Normal mood and affect. Behavior is normal. EENT: Pupils normal.  Conjunctivae are normal. No scleral icterus. Neck supple.  Cardiovascular: Normal rate, regular rhythm. No edema Pulmonary/chest: Effort normal and breath sounds normal. No wheezing, rales or rhonchi. Abdominal: Soft, nondistended, nontender. Bowel sounds active throughout. There are no masses palpable. No hepatomegaly. Neurological: Alert and oriented to person place and time. Skin: Skin is warm and dry. No rashes noted.  Tye Savoy, NP  02/22/2020, 9:58 AM  Cc:  Referring Provider Briscoe, Jannifer Rodney, MD

## 2020-02-22 NOTE — Progress Notes (Signed)
Reviewed and agree with management plan.  Lyndsee Casa T. Bindu Docter, MD FACG [facility]  

## 2020-02-22 NOTE — Patient Instructions (Signed)
If you are age 40 or older, your body mass index should be between 23-30. Your Body mass index is 20.66 kg/m. If this is out of the aforementioned range listed, please consider follow up with your Primary Care Provider.  If you are age 92 or younger, your body mass index should be between 19-25. Your Body mass index is 20.66 kg/m. If this is out of the aformentioned range listed, please consider follow up with your Primary Care Provider.   You have been scheduled for an endoscopy. Please follow written instructions given to you at your visit today. If you use inhalers (even only as needed), please bring them with you on the day of your procedure. Your physician has requested that you go to www.startemmi.com and enter the access code given to you at your visit today. This web site gives a general overview about your procedure. However, you should still follow specific instructions given to you by our office regarding your preparation for the procedure.  We have sent the following medications to your pharmacy for you to pick up at your convenience: Anusol  Thank you for choosing me and  [facility].   Willette Cluster, NP

## 2020-02-28 ENCOUNTER — Ambulatory Visit (INDEPENDENT_AMBULATORY_CARE_PROVIDER_SITE_OTHER): Payer: 59

## 2020-02-28 DIAGNOSIS — Z1159 Encounter for screening for other viral diseases: Secondary | ICD-10-CM

## 2020-02-29 LAB — SARS CORONAVIRUS 2 (TAT 6-24 HRS): SARS Coronavirus 2: NEGATIVE

## 2020-03-01 ENCOUNTER — Other Ambulatory Visit: Payer: Self-pay

## 2020-03-01 ENCOUNTER — Ambulatory Visit (AMBULATORY_SURGERY_CENTER): Payer: 59 | Admitting: Gastroenterology

## 2020-03-01 ENCOUNTER — Encounter: Payer: Self-pay | Admitting: Gastroenterology

## 2020-03-01 VITALS — BP 117/80 | HR 80 | Temp 97.1°F | Resp 17 | Ht 64.0 in | Wt 120.0 lb

## 2020-03-01 DIAGNOSIS — R1310 Dysphagia, unspecified: Secondary | ICD-10-CM

## 2020-03-01 DIAGNOSIS — K219 Gastro-esophageal reflux disease without esophagitis: Secondary | ICD-10-CM | POA: Diagnosis not present

## 2020-03-01 DIAGNOSIS — K449 Diaphragmatic hernia without obstruction or gangrene: Secondary | ICD-10-CM | POA: Diagnosis not present

## 2020-03-01 DIAGNOSIS — K222 Esophageal obstruction: Secondary | ICD-10-CM

## 2020-03-01 DIAGNOSIS — K2950 Unspecified chronic gastritis without bleeding: Secondary | ICD-10-CM | POA: Diagnosis not present

## 2020-03-01 MED ORDER — SODIUM CHLORIDE 0.9 % IV SOLN: 500.0000 mL | Freq: Once | INTRAVENOUS | Status: DC

## 2020-03-01 NOTE — Progress Notes (Signed)
Called to room to assist during endoscopic procedure.  Patient ID and intended procedure confirmed with present staff. Received instructions for my participation in the procedure from the performing physician.  

## 2020-03-01 NOTE — Patient Instructions (Signed)
Handouts given for Stricture, Post-dilation diet and GERD (hiatal hernia).  Follow the diet today with nothing per mouth until 7pm, then clear liquids for 1 hour, followed by soft food until tomorrow.  YOU HAD AN ENDOSCOPIC PROCEDURE TODAY AT THE [facility]:   Refer to the procedure report that was given to you for any specific questions about what was found during the examination.  If the procedure report does not answer your questions, please call your gastroenterologist to clarify.  If you requested that your care partner not be given the details of your procedure findings, then the procedure report has been included in a sealed envelope for you to review at your convenience later.  YOU SHOULD EXPECT: Some feelings of bloating in the abdomen. Passage of more gas than usual.  Walking can help get rid of the air that was put into your GI tract during the procedure and reduce the bloating. If you had a lower endoscopy (such as a colonoscopy or flexible sigmoidoscopy) you may notice spotting of blood in your stool or on the toilet paper. If you underwent a bowel prep for your procedure, you may not have a normal bowel movement for a few days.  Please Note:  You might notice some irritation and congestion in your nose or some drainage.  This is from the oxygen used during your procedure.  There is no need for concern and it should clear up in a day or so.  SYMPTOMS TO REPORT IMMEDIATELY:   Following upper endoscopy (EGD)  Vomiting of blood or coffee ground material  New chest pain or pain under the shoulder blades  Painful or persistently difficult swallowing  New shortness of breath  Fever of 100F or higher  Black, tarry-looking stools  For urgent or emergent issues, a gastroenterologist can be reached at any hour by calling [phone]. Do not use MyChart messaging for urgent concerns.    DIET:  We do recommend a small meal at first, but then you may proceed to your regular  diet.  Drink plenty of fluids but you should avoid alcoholic beverages for 24 hours.  ACTIVITY:  You should plan to take it easy for the rest of today and you should NOT DRIVE or use heavy machinery until tomorrow (because of the sedation medicines used during the test).    FOLLOW UP: Our staff will call the number listed on your records 48-72 hours following your procedure to check on you and address any questions or concerns that you may have regarding the information given to you following your procedure. If we do not reach you, we will leave a message.  We will attempt to reach you two times.  During this call, we will ask if you have developed any symptoms of COVID 19. If you develop any symptoms (ie: fever, flu-like symptoms, shortness of breath, cough etc.) before then, please call [phone].  If you test positive for Covid 19 in the 2 weeks post procedure, please call and report this information to [place].    If any biopsies were taken you will be contacted by phone or by letter within the next 1-3 weeks.  Please call us at [phone] if you have not heard about the biopsies in 3 weeks.    SIGNATURES/CONFIDENTIALITY: You and/or your care partner have signed paperwork which will be entered into your electronic medical record.  These signatures attest to the fact that that the information above on your After Visit Summary has been  reviewed and is understood.  Full responsibility of the confidentiality of this discharge information lies with you and/or your care-partner. 

## 2020-03-01 NOTE — Op Note (Signed)
[facility] Patient Name: Carolyn Rasmussen Procedure Date: 03/01/2020 3:25 PM MRN: [id number] Endoscopist: Meryl Dare , MD Age: 39 Referring MD:  Date of Birth: 04/02/1981 Gender: Female Account #: [id number] Procedure:                Upper GI endoscopy Indications:              Dysphagia, Gastroesophageal reflux disease Medicines:                Monitored Anesthesia Care Procedure:                Pre-Anesthesia Assessment:                           - Prior to the procedure, a History and Physical                            was performed, and patient medications and                            allergies were reviewed. The patient's tolerance of                            previous anesthesia was also reviewed. The risks                            and benefits of the procedure and the sedation                            options and risks were discussed with the patient.                            All questions were answered, and informed consent                            was obtained. Prior Anticoagulants: The patient has                            taken no previous anticoagulant or antiplatelet                            agents. ASA Grade Assessment: II - A patient with                            mild systemic disease. After reviewing the risks                            and benefits, the patient was deemed in                            satisfactory condition to undergo the procedure.                           After obtaining informed consent, the endoscope was  passed under direct vision. Throughout the                            procedure, the patient's blood pressure, pulse, and                            oxygen saturations were monitored continuously. The                            Endoscope was introduced through the mouth, and                            advanced to the second part of duodenum. The upper                            GI  endoscopy was accomplished without difficulty.                            The patient tolerated the procedure well. Scope In: Scope Out: Findings:                 One benign-appearing, intrinsic moderate stenosis                            was found 38 cm from the incisors at the EGJ. This                            stenosis measured 1.2 cm (inner diameter) x less                            than one cm (in length). The stenosis was                            traversed. A guidewire was placed and the scope was                            withdrawn. Dilations were performed with Savary                            dilators with mild resistance at 13 mm, 14 mm and                            15 mm.                           The exam of the esophagus was otherwise normal.                           A small hiatal hernia was present.                           Patchy mildly erythematous mucosa without bleeding  was found in the gastric body and in the gastric                            antrum. Biopsies were taken with a cold forceps for                            histology.                           The exam of the stomach was otherwise normal.                           The duodenal bulb and second portion of the                            duodenum were normal. Complications:            No immediate complications. Estimated Blood Loss:     Estimated blood loss was minimal. Impression:               - Benign-appearing esophageal stenosis. Dilated.                           - Small hiatal hernia.                           - Erythematous mucosa in the gastric body and                            antrum. Biopsied.                           - Normal duodenal bulb and second portion of the                            duodenum. Recommendation:           - Patient has a contact number available for                            emergencies. The signs and symptoms of potential                             delayed complications were discussed with the                            patient. Return to normal activities tomorrow.                            Written discharge instructions were provided to the                            patient.                           - Clear liquid diet for 2 hours, then advance as  tolerated to soft diet today.                           - Resume prior diet tomorrow.                           - Follow antireflux measures long term.                           - Continue present medications.                           - Await pathology results.                           - Return to GI office in 6 weeks. Ladene Artist, MD 03/01/2020 3:53:34 PM This report has been signed electronically.

## 2020-03-01 NOTE — Progress Notes (Signed)
Pt's states no medical or surgical changes since previsit or office visit.   LC temp, SM IV, DT vitals.

## 2020-03-03 ENCOUNTER — Telehealth: Payer: Self-pay

## 2020-03-03 ENCOUNTER — Ambulatory Visit: Payer: 59

## 2020-03-03 NOTE — Telephone Encounter (Signed)
  Follow up Call-  Call back number 03/01/2020  Post procedure Call Back phone  # [phone]  Permission to leave phone message Yes  Some recent data might be hidden     Patient questions:  Do you have a fever, pain , or abdominal swelling? No. Pain Score  0 *  Have you tolerated food without any problems? Yes.    Have you been able to return to your normal activities? Yes.    Do you have any questions about your discharge instructions: Diet   No. Medications  No. Follow up visit  No.  Do you have questions or concerns about your Care? No.  Actions: * If pain score is 4 or above: No action needed, pain <4.  1. Have you developed a fever since your procedure? no  2.   Have you had an respiratory symptoms (SOB or cough) since your procedure? no  3.   Have you tested positive for COVID 19 since your procedure no  4.   Have you had any family members/close contacts diagnosed with the COVID 19 since your procedure?  no   If yes to any of these questions please route to Laverna Peace, RN and Charlett Lango, RN

## 2020-03-08 ENCOUNTER — Encounter: Payer: Self-pay | Admitting: Gastroenterology

## 2020-03-14 ENCOUNTER — Encounter: Payer: 59 | Admitting: Internal Medicine

## 2020-03-29 ENCOUNTER — Ambulatory Visit: Payer: 59 | Admitting: Nurse Practitioner

## 2021-09-23 ENCOUNTER — Ambulatory Visit (HOSPITAL_COMMUNITY)
Admission: EM | Admit: 2021-09-23 | Discharge: 2021-09-23 | Disposition: A | Discharge status: Home / Self Care | Payer: BC Managed Care – PPO | Attending: Family | Admitting: Family

## 2021-09-23 ENCOUNTER — Other Ambulatory Visit: Payer: Self-pay

## 2021-09-23 DIAGNOSIS — F331 Major depressive disorder, recurrent, moderate: Secondary | ICD-10-CM | POA: Diagnosis not present

## 2021-09-23 MED ORDER — SERTRALINE HCL 100 MG PO TABS: 100.0000 mg | ORAL_TABLET | Freq: Every day | ORAL | 0 refills | Status: DC

## 2021-09-23 MED ORDER — HYDROXYZINE PAMOATE 25 MG PO CAPS: 25.0000 mg | ORAL_CAPSULE | Freq: Three times a day (TID) | ORAL | 0 refills | Status: AC | PRN

## 2021-09-23 MED ORDER — SERTRALINE HCL 100 MG PO TABS: 100.0000 mg | ORAL_TABLET | Freq: Every day | ORAL | 0 refills | Status: AC

## 2021-09-23 MED ORDER — HYDROXYZINE PAMOATE 25 MG PO CAPS: 25.0000 mg | ORAL_CAPSULE | Freq: Three times a day (TID) | ORAL | 0 refills | Status: DC | PRN

## 2021-09-23 NOTE — Discharge Instructions (Signed)
Take all medications as prescribed. Keep all follow-up appointments as scheduled.  Do not consume alcohol or use illegal drugs while on prescription medications. Report any adverse effects from your medications to your primary care provider promptly.  In the event of recurrent symptoms or worsening symptoms, call 911, a crisis hotline, or go to the nearest emergency department for evaluation.   

## 2021-09-23 NOTE — ED Provider Notes (Signed)
[facility] Medical Screening Exam  Patient Name: Carolyn Rasmussen MRN: [id number] Date of Evaluation: 09/23/21 Chief Complaint:   Diagnosis:  Final diagnoses:  MDD (major depressive disorder), recurrent episode, moderate (HCC)   Carolyn Rasmussen, 40 y.o., female patient seen face to face by this provider, consulted and chart reviewed on 09/23/21.  On evaluation Carolyn Rasmussen.   History of Present illness: Carolyn Rasmussen is a 40 y.o. female. Presents requesting a medication refill.  Reports she is currently taking Zoloft 100 mg however states she has been breaking the pill in half.  Stated I cannot find a prescriber to refill her medications.  Reports my primary care provider states she would not refill my medications without seeing a psychiatrist.   Reports she was followed by [facility] however has not " had time to get a follow-up appointment. "  She denies suicidal or homicidal ideations.  Denies auditory or visual hallucinations.  Denied recent inpatient admissions.  Denied illicit drug use or substance abuse history.  Patient reports a history with major depressive disorder, generalized anxiety disorder and undiagnosed trauma.  States "I can feel my mood is changing, because I have not been taking the right dose of medication."  Support, encouragement and reassurance was provided..  During evaluation Carolyn Rasmussen is sitting in no acute distress. She is alert/oriented x 4; calm/cooperative; and mood congruent with affect.  She is speaking in a clear tone at moderate volume, and normal pace; with good eye contact.  Her thought process is coherent and relevant; There is no indication that she is currently responding to internal/external stimuli or experiencing delusional thought content; and she has denied suicidal/self-harm/homicidal ideation, psychosis, and paranoia.   Patient has remained calm throughout assessment and has answered questions  appropriately.     At this time Carolyn Rasmussen is educated and verbalizes understanding of mental health resources and other crisis services in the community. She is instructed to call 911 and present to the nearest emergency room should she experience any suicidal/homicidal ideation, auditory/visual/hallucinations, or detrimental worsening of her mental health condition.  She was a also advised by Clinical research associate that she could call the toll-free phone on insurance card to assist with identifying in network counselors and agencies or number on back of Medicaid card to speak with care coordinator    Psychiatric Specialty Exam  Presentation  General Appearance:Appropriate for Environment  Eye Contact:Good  Speech:Clear and Coherent  Speech Volume:Normal  Handedness:Right   Mood and Affect  Mood:Anxious; Depressed  Affect:Congruent   Thought Process  Thought Processes:Coherent  Descriptions of Associations:Intact  Orientation:Full (Time, Place and Person)  Thought Content:Logical    Hallucinations:None  Ideas of Reference:None  Suicidal Thoughts:No  Homicidal Thoughts:No   Sensorium  Memory:Immediate Fair; Remote Fair; Remote Good  Judgment:Good  Insight:Good   Executive Functions  Concentration:Fair  Attention Span:Good  Recall:Good  Fund of Knowledge:Good  Language:Good   Psychomotor Activity  Psychomotor Activity:Normal   Assets  Assets:Social Support; Desire for Improvement   Sleep  Sleep:Fair  Number of hours:  No data recorded  Nutritional Assessment (For [facility] and [facility] admissions only) Has the patient had a weight loss or gain of 10 pounds or more in the last 3 months?: Yes Has the patient had a decrease in food intake/or appetite?: Yes Does the patient have dental problems?: Yes Does the patient have eating habits or behaviors that may be indicators of an eating disorder including binging or inducing vomiting?: Yes Has  the patient  recently lost weight without trying?: 0 Has the patient been eating poorly because of a decreased appetite?: 0 Malnutrition Screening Tool Score: 0    Physical Exam: Physical Exam Vitals reviewed.  Eyes:     Pupils: Pupils are equal, round, and reactive to light.  Cardiovascular:     Rate and Rhythm: Normal rate and regular rhythm.  Pulmonary:     Effort: Pulmonary effort is normal.     Breath sounds: Normal breath sounds.  Neurological:     Mental Status: She is alert.  Psychiatric:        Attention and Perception: Attention normal.        Mood and Affect: Mood normal.        Speech: Speech normal.        Behavior: Behavior normal.        Thought Content: Thought content normal.        Cognition and Memory: Cognition and memory normal.   Review of Systems  HENT: Negative.    Eyes: Negative.   Cardiovascular: Negative.   Psychiatric/Behavioral:  Positive for depression. Negative for hallucinations and suicidal ideas. The patient is nervous/anxious.   All other systems reviewed and are negative. There were no vitals taken for this visit. There is no height or weight on file to calculate BMI.  Musculoskeletal: Strength & Muscle Tone: within normal limits Gait & Station: normal Patient leans: N/A   [facility] Discharge Disposition for Follow up and Recommendations: Based on my evaluation the patient does not appear to have an emergency medical condition and can be discharged with resources and follow up care in outpatient services for Medication Management -Refill Zoloft 100 mg and hydroxyzine 25 mg x 30 days -Patient was provided with additional outpatient resources   Oneta Rack, NP 09/23/2021, 12:29 PM

## 2021-09-23 NOTE — ED Notes (Signed)
Pt discharged with resources in hand. Safety maintained.
